# Patient Record
Sex: Female | Born: 1965 | Race: White | Hispanic: No | Marital: Married | Smoking: Never smoker
Health system: Southern US, Community
[De-identification: ages and names within clinical notes are randomized; demographics above are authoritative.]

## PROBLEM LIST (undated history)

## (undated) DIAGNOSIS — IMO0002 Reserved for concepts with insufficient information to code with codable children: Secondary | ICD-10-CM

## (undated) DIAGNOSIS — N946 Dysmenorrhea, unspecified: Secondary | ICD-10-CM

## (undated) DIAGNOSIS — R32 Unspecified urinary incontinence: Secondary | ICD-10-CM

## (undated) DIAGNOSIS — D649 Anemia, unspecified: Secondary | ICD-10-CM

## (undated) DIAGNOSIS — K5792 Diverticulitis of intestine, part unspecified, without perforation or abscess without bleeding: Secondary | ICD-10-CM

## (undated) DIAGNOSIS — F419 Anxiety disorder, unspecified: Secondary | ICD-10-CM

## (undated) DIAGNOSIS — D219 Benign neoplasm of connective and other soft tissue, unspecified: Secondary | ICD-10-CM

## (undated) DIAGNOSIS — M1990 Unspecified osteoarthritis, unspecified site: Secondary | ICD-10-CM

## (undated) HISTORY — DX: Unspecified osteoarthritis, unspecified site: M19.90

## (undated) HISTORY — DX: Anxiety disorder, unspecified: F41.9

## (undated) HISTORY — DX: Reserved for concepts with insufficient information to code with codable children: IMO0002

## (undated) HISTORY — DX: Dysmenorrhea, unspecified: N94.6

## (undated) HISTORY — DX: Benign neoplasm of connective and other soft tissue, unspecified: D21.9

## (undated) HISTORY — DX: Unspecified urinary incontinence: R32

## (undated) HISTORY — DX: Anemia, unspecified: D64.9

## (undated) HISTORY — DX: Diverticulitis of intestine, part unspecified, without perforation or abscess without bleeding: K57.92

---

## 2000-05-22 ENCOUNTER — Ambulatory Visit (HOSPITAL_COMMUNITY): Admission: RE | Admit: 2000-05-22 | Discharge: 2000-05-22 | Payer: Self-pay | Admitting: *Deleted

## 2000-05-22 ENCOUNTER — Encounter: Payer: Self-pay | Admitting: *Deleted

## 2001-11-24 ENCOUNTER — Ambulatory Visit (HOSPITAL_COMMUNITY): Admission: RE | Admit: 2001-11-24 | Discharge: 2001-11-24 | Payer: Self-pay | Admitting: *Deleted

## 2001-11-24 ENCOUNTER — Encounter: Payer: Self-pay | Admitting: *Deleted

## 2007-10-09 ENCOUNTER — Encounter: Admission: RE | Admit: 2007-10-09 | Discharge: 2007-10-09 | Payer: Self-pay | Admitting: Obstetrics and Gynecology

## 2009-10-13 ENCOUNTER — Encounter: Admission: RE | Admit: 2009-10-13 | Discharge: 2009-10-13 | Payer: Self-pay | Admitting: Obstetrics and Gynecology

## 2010-10-11 ENCOUNTER — Other Ambulatory Visit: Payer: Self-pay | Admitting: Obstetrics and Gynecology

## 2010-10-11 DIAGNOSIS — Z1231 Encounter for screening mammogram for malignant neoplasm of breast: Secondary | ICD-10-CM

## 2010-10-24 ENCOUNTER — Ambulatory Visit
Admission: RE | Admit: 2010-10-24 | Discharge: 2010-10-24 | Disposition: A | Discharge status: Home / Self Care | Payer: Self-pay | Source: Ambulatory Visit | Attending: Obstetrics and Gynecology | Admitting: Obstetrics and Gynecology

## 2010-10-24 ENCOUNTER — Other Ambulatory Visit: Payer: Self-pay | Admitting: Obstetrics and Gynecology

## 2010-10-24 DIAGNOSIS — Z1231 Encounter for screening mammogram for malignant neoplasm of breast: Secondary | ICD-10-CM

## 2011-01-20 ENCOUNTER — Encounter: Payer: Self-pay | Admitting: Family Medicine

## 2011-01-20 ENCOUNTER — Inpatient Hospital Stay (INDEPENDENT_AMBULATORY_CARE_PROVIDER_SITE_OTHER)
Admission: RE | Admit: 2011-01-20 | Discharge: 2011-01-20 | Disposition: A | Discharge status: Home / Self Care | Payer: BC Managed Care – PPO | Source: Ambulatory Visit | Attending: Family Medicine | Admitting: Family Medicine

## 2011-01-20 DIAGNOSIS — M25569 Pain in unspecified knee: Secondary | ICD-10-CM

## 2011-01-20 DIAGNOSIS — IMO0001 Reserved for inherently not codable concepts without codable children: Secondary | ICD-10-CM

## 2011-01-20 DIAGNOSIS — R51 Headache: Secondary | ICD-10-CM

## 2011-01-20 DIAGNOSIS — R509 Fever, unspecified: Secondary | ICD-10-CM

## 2011-01-20 LAB — CONVERTED CEMR LAB
Nitrite: NEGATIVE
pH: 7

## 2011-01-24 ENCOUNTER — Encounter: Payer: Self-pay | Admitting: Family Medicine

## 2011-02-16 ENCOUNTER — Encounter (INDEPENDENT_AMBULATORY_CARE_PROVIDER_SITE_OTHER): Payer: Self-pay | Admitting: *Deleted

## 2011-02-16 ENCOUNTER — Inpatient Hospital Stay (INDEPENDENT_AMBULATORY_CARE_PROVIDER_SITE_OTHER)
Admission: RE | Admit: 2011-02-16 | Discharge: 2011-02-16 | Disposition: A | Discharge status: Home / Self Care | Payer: BC Managed Care – PPO | Source: Ambulatory Visit

## 2011-02-16 DIAGNOSIS — IMO0001 Reserved for inherently not codable concepts without codable children: Secondary | ICD-10-CM | POA: Insufficient documentation

## 2011-02-17 ENCOUNTER — Encounter: Payer: Self-pay | Admitting: Family Medicine

## 2011-08-06 NOTE — Progress Notes (Signed)
Summary: Followup Call  Followup call to patient; discussed lab results.  She feels much better.  Notes that she she still has a minimal amount of discomfort in her left knee.  She notes that she had been taking Tylenol for knee pain and myalgias, which could explain her slightly elevated transaminases.  Plan:  Because urine culture revealed group B strep, will switch to AMOXICILLIN 875 MG TABS (AMOXICILLIN) One by mouth two times a day for one week. Recommend repeat urine culture when finished amoxicillin.  Discontinue doxycycline.  Prescriptions: AMOXICILLIN 875 MG TABS (AMOXICILLIN) One by mouth two times a day  #14 x 0   Entered and Authorized by:   Donna Christen MD   Signed by:   Donna Christen MD on 01/24/2011   Method used:   Electronically to        [facility] [phone]* (retail)       [address] [place], [place]  [address]       Ph: [phone]       Fax: [phone]   RxID:   [id number]   Donna Christen MD  Jan 24, 2011 1:18 PM

## 2011-08-06 NOTE — Assessment & Plan Note (Signed)
Summary: REPEAT LABS FOR ELEVATED LIVER PANELS  Nurse Visit   Allergies: No Known Drug Allergies  Orders Added: 1)  TLB-Hepatic/Liver Function Pnl [80076-HEPATIC]  Blood for hepatic panel drawn.  Lajean Saver RN  February 16, 2011 12:54 PM

## 2011-08-06 NOTE — Progress Notes (Signed)
Summary: ACHES,FEVER,HEADACHE(RM5)   Vital Signs:  Patient Profile:   45 Years Old Female CC:      Headache/fever/ body ache Height:     68 inches Weight:      155 pounds O2 Sat:      97 % O2 treatment:    Room Air Temp:     99.2 degrees F oral Pulse rate:   96 / minute Resp:     18 per minute BP sitting:   107 / 72  (left arm) Cuff size:   regular  Vitals Entered By: Linton Flemings RN (Jan 20, 2011 9:31 AM)                  Updated Prior Medication List: No Medications Current Allergies: No known allergies History of Present Illness Chief Complaint: Headache/fever/ body ache History of Present Illness:  Subjective: Patient complains of onset two days prior of fatigue, mild frontal headache, myalgias from the waist down, and light-headedness.  Yesterday she had aching in her left knee.   No known tick bites.  She notes that she is presently having her menses No sore throat No cough No pleuritic pain No wheezing No nasal congestion No post-nasal drainage No sinus pain/pressure No itchy/red eyes No earache No hemoptysis No SOB + fever/chills.  She had a fever to 103 No nausea No vomiting No abdominal pain No diarrhea No skin rashes No urinary symptoms     REVIEW OF SYSTEMS Constitutional Symptoms       Complains of fever, chills, and fatigue.     Denies night sweats, weight loss, and weight gain.  Eyes       Complains of eye pain.      Denies change in vision, eye discharge, glasses, contact lenses, and eye surgery. Ear/Nose/Throat/Mouth       Denies hearing loss/aids, change in hearing, ear pain, ear discharge, dizziness, frequent runny nose, frequent nose bleeds, sinus problems, sore throat, hoarseness, and tooth pain or bleeding.  Respiratory       Denies dry cough, productive cough, wheezing, shortness of breath, asthma, bronchitis, and emphysema/COPD.  Cardiovascular       Complains of tires easily with exhertion.      Denies murmurs and chest  pain.    Gastrointestinal       Denies stomach pain, nausea/vomiting, diarrhea, constipation, blood in bowel movements, and indigestion. Genitourniary       Denies painful urination, kidney stones, and loss of urinary control. Neurological       Denies paralysis, seizures, and fainting/blackouts. Musculoskeletal       Complains of muscle pain and joint stiffness.      Denies joint pain, decreased range of motion, redness, swelling, muscle weakness, and gout.  Skin       Denies bruising, unusual mles/lumps or sores, and hair/skin or nail changes.  Psych       Denies mood changes, temper/anger issues, anxiety/stress, speech problems, depression, and sleep problems. Other Comments: SYMPTOMS STARTED wED W/ T- MAX 103, BODY ACHES FROM THE WAIST DOWN AND PAIN BELOW LEFT KNESS   Past History:  Past Medical History: Unremarkable  Past Surgical History: Denies surgical history  Family History: CA- FATHER CAD- FATHER   Objective:  Appearance:  Patient appears healthy, stated age, and in no acute distress  Eyes:  Pupils are equal, round, and reactive to light and accomodation.  Extraocular movement is intact.  Conjunctivae are not inflamed.  Skin:  No rash Ears:  Canals normal.  Tympanic membranes normal.   Nose:  Mildly congested turbinates.  No sinus tenderness  Mouth:  No lesions Pharynx:  Normal  Neck:  Supple.  Slightly tender shotty posterior nodes are palpated bilaterally.  Lungs:  Clear to auscultation.  Breath sounds are equal.  Chest:  Tenderness over sternum Heart:  Regular rate and rhythm without murmurs, rubs, or gallops.  Abdomen:  Mild tenderness over spleen without masses or hepatosplenomegaly.  Bowel sounds are present.  No CVA or flank tenderness.   No lower abdominal or pelvic tenderness CBC:  WBC 8.0; 13.3 LY, 6.1 MO, 80.6 GR; Hgb 13.0 urinalysis (dipstick):  3+ blood, 3+ leuks Assessment New Problems: KNEE PAIN, LEFT (ICD-719.46) HEADACHE (ICD-784.0) MYALGIA  (ICD-729.1) FEVER UNSPECIFIED (ICD-780.60)  ? LYMES DISEASE, ? UTI, ? EARLY URI  Plan New Medications/Changes: DOXYCYCLINE HYCLATE 100 MG CAPS (DOXYCYCLINE HYCLATE) One by mouth two times a day  #20 x 0, 01/20/2011, Donna Christen MD  New Orders: CBC w/Diff [[address]-60454] Urinalysis [CPT-81003] T-Sed Rate (Automated) [09811-91478] T-CMP with estimated GFR [[address]] T-Lyme Disease [29562-13086] T-Rock[place] Mtn Spotted Fever AB IgG IgM [57846-96295] T-CK Total [82550-23250] T-Culture, Urine [28413-24401] Services provided After hours-Weekends-Holidays [99051] New Patient Level V [99205] Planning Comments:   Urine culture pending,  RMSF and Lymes disease titers.  Check sed rate, CMP, and CK.  Begin empiric doxycycline.  Rest, increased fluids.   The patient and/or caregiver has been counseled thoroughly with regard to medications prescribed including dosage, schedule, interactions, rationale for use, and possible side effects and they verbalize understanding.  Diagnoses and expected course of recovery discussed and will return if not improved as expected or if the condition worsens. Patient and/or caregiver verbalized understanding.  Prescriptions: DOXYCYCLINE HYCLATE 100 MG CAPS (DOXYCYCLINE HYCLATE) One by mouth two times a day  #20 x 0   Entered and Authorized by:   Donna Christen MD   Signed by:   Donna Christen MD on 01/20/2011   Method used:   Print then Give to Patient   RxID:   [id number]   Orders Added: 1)  CBC w/Diff [59563-87564] 2)  Urinalysis [CPT-81003] 3)  T-Sed Rate (Automated) [33295-18841] 4)  T-CMP with estimated GFR [[address]] 5)  T-Lyme Disease [66063-01601] 6)  T-Rocky Mtn Spotted Fever AB IgG IgM [09323-55732] 7)  T-CK Total [82550-23250] 8)  T-Culture, Urine [20254-27062] 9)  Services provided After hours-Weekends-Holidays [99051] 10)  New Patient Level V [99205]    Laboratory Results   Urine Tests  Date/Time Received: Jan 20, 2011 10:26 AM    Date/Time Reported: Jan 20, 2011 10:26 AM   Routine Urinalysis   Color: yellow Appearance: Clear Glucose: negative   (Normal Range: Negative) Bilirubin: negative   (Normal Range: Negative) Ketone: negative   (Normal Range: Negative) Spec. Gravity: 1.020   (Normal Range: 1.003-1.035) Blood: 3+   (Normal Range: Negative) pH: 7.0   (Normal Range: 5.0-8.0) Protein: 1+   (Normal Range: Negative) Urobilinogen: 0.2   (Normal Range: 0-1) Nitrite: negative   (Normal Range: Negative) Leukocyte Esterace: 3+   (Normal Range: Negative)

## 2011-08-06 NOTE — Letter (Signed)
Summary: Out of Work  [facility]  [address]   [place], [place] [address]   Phone: [phone]  Fax: [phone]    Jan 20, 2011   Employee:  [name]    To Whom It May Concern:   Lynn Schmidt was evaluated in our clinic this morning.   If you need additional information, please feel free to contact our office.         Sincerely,    Donna Christen MD

## 2011-08-06 NOTE — Progress Notes (Signed)
Summary: Followup Call  Followup call:  notified of lab results Donna Christen MD  February 17, 2011 9:54 AM

## 2011-10-15 ENCOUNTER — Other Ambulatory Visit: Payer: Self-pay | Admitting: Obstetrics and Gynecology

## 2011-10-15 DIAGNOSIS — Z1231 Encounter for screening mammogram for malignant neoplasm of breast: Secondary | ICD-10-CM

## 2011-10-23 ENCOUNTER — Inpatient Hospital Stay: Admission: RE | Admit: 2011-10-23 | Payer: BC Managed Care – PPO | Source: Ambulatory Visit

## 2011-10-30 ENCOUNTER — Ambulatory Visit
Admission: RE | Admit: 2011-10-30 | Discharge: 2011-10-30 | Disposition: A | Discharge status: Home / Self Care | Payer: BC Managed Care – PPO | Source: Ambulatory Visit | Attending: Obstetrics and Gynecology | Admitting: Obstetrics and Gynecology

## 2012-06-23 ENCOUNTER — Emergency Department
Admission: EM | Admit: 2012-06-23 | Discharge: 2012-06-23 | Disposition: A | Discharge status: Home / Self Care | Payer: Self-pay | Source: Home / Self Care

## 2012-06-23 DIAGNOSIS — R51 Headache: Secondary | ICD-10-CM

## 2012-06-23 LAB — POCT CBC W AUTO DIFF (K'VILLE URGENT CARE)

## 2012-06-23 MED ORDER — PROMETHAZINE HCL 25 MG PO TABS: 25.0000 mg | ORAL_TABLET | Freq: Four times a day (QID) | ORAL | Status: DC | PRN

## 2012-06-23 MED ORDER — KETOROLAC TROMETHAMINE 60 MG/2ML IM SOLN
60.0000 mg | Freq: Once | INTRAMUSCULAR | Status: AC
  Administered 2012-06-23: 60 mg via INTRAMUSCULAR

## 2012-06-23 MED ORDER — SUMATRIPTAN SUCCINATE 25 MG PO TABS: 25.0000 mg | ORAL_TABLET | ORAL | Status: DC | PRN

## 2012-06-23 NOTE — ED Provider Notes (Signed)
History     CSN: [id number]  Arrival date & time 06/23/12  1619   First MD Initiated Contact with Patient 06/23/12 1622      Chief Complaint  Patient presents with  . Headache    x 1 day  . Nausea    x 1 day   HPI Comments: Pt reports prior history of recurrent headaches over the last 20 years without the formal diagnosis of migraine (has not been formally evaluated for this).  Pt states that headache is usually frontal in nature, however has had frontal and occipital headache over last 24 hours.  Pt also reports blurred vision and nausea associated with this headache.  Nausea is common occurrence with headaches.  Blurred vision is a rare association with headaches per pt.  + photophobia.  No LOV, hemiparesis, confusion.   Patient is a 46 y.o. female presenting with headaches.  Headache The primary symptoms include headaches, visual change and nausea. Primary symptoms do not include fever. The symptoms began yesterday (Pt states that she had mild symptoms yesterday but woke up this am with severe frontal as well as occipital headache. ). The symptoms are unchanged.  The headache is associated with visual change.  Pt does report that HA woke her up from sleep.   History reviewed. No pertinent past medical history.  History reviewed. No pertinent past surgical history.  Family History  Problem Relation Age of Onset  . Cancer Mother     lung and skin  . Heart attack Mother     History  Substance Use Topics  . Smoking status: Never Smoker   . Smokeless tobacco: Never Used  . Alcohol Use: No    OB History    Grav Para Term Preterm Abortions TAB SAB Ect Mult Living                  Review of Systems  Constitutional: Negative for fever.  Gastrointestinal: Positive for nausea.  Neurological: Positive for headaches.  All other systems reviewed and are negative.    Allergies  Review of patient's allergies indicates no known allergies.  Home Medications  No  current outpatient prescriptions on file.  BP 117/79  Pulse 76  Temp 97.6 F (36.4 C) (Oral)  Resp 16  Ht 5\' 7"  (1.702 m)  Wt 147 lb (66.679 kg)  BMI 23.02 kg/m2  SpO2 99%  Physical Exam  Constitutional: She appears well-developed and well-nourished.  HENT:  Head: Normocephalic and atraumatic.  Right Ear: External ear normal.  Left Ear: External ear normal.  Mouth/Throat: Oropharynx is clear and moist.  Eyes: Conjunctivae normal and EOM are normal. Pupils are equal, round, and reactive to light.  Neck: Normal range of motion. Neck supple.  Cardiovascular: Normal rate and regular rhythm.   Pulmonary/Chest: Effort normal and breath sounds normal.  Abdominal: Soft. Bowel sounds are normal.  Musculoskeletal: Normal range of motion.  Neurological: She is alert. No cranial nerve deficit. Coordination normal.  Skin: Skin is warm.    ED Course  Procedures (including critical care time)  Labs Reviewed - No data to display No results found.   1. Headache       MDM  Sxs seem to be fairly consistent with migrainous flare.  However, given change in morphology of headache as well as HA waking pt up from sleep, will obtain Head CT w/ and w/o contrast to eval for intracranial process.  Toradol and phenergan for symptomatic management.  Will place on trial of imitrex.  Plan to follow up with PCP if sxs persist for formal evaluation.      The patient and/or caregiver has been counseled thoroughly with regard to treatment plan and/or medications prescribed including dosage, schedule, interactions, rationale for use, and possible side effects and they verbalize understanding. Diagnoses and expected course of recovery discussed and will return if not improved as expected or if the condition worsens. Patient and/or caregiver verbalized understanding.             Doree Albee, MD 06/23/12 [phone]

## 2012-06-23 NOTE — ED Notes (Signed)
Lynn Schmidt complains of headache and nausea for 1 day. She has also had chills, pressure in face, vomiting and vision changes. Denies fever or sweats. She tried tylenol and sudafed without relief.

## 2012-06-24 ENCOUNTER — Telehealth: Payer: Self-pay | Admitting: *Deleted

## 2012-06-24 ENCOUNTER — Ambulatory Visit (HOSPITAL_BASED_OUTPATIENT_CLINIC_OR_DEPARTMENT_OTHER)
Admission: RE | Admit: 2012-06-24 | Discharge: 2012-06-24 | Disposition: A | Discharge status: Home / Self Care | Payer: BC Managed Care – PPO | Source: Ambulatory Visit | Attending: Family Medicine | Admitting: Family Medicine

## 2012-06-24 DIAGNOSIS — R112 Nausea with vomiting, unspecified: Secondary | ICD-10-CM | POA: Insufficient documentation

## 2012-06-24 DIAGNOSIS — R51 Headache: Secondary | ICD-10-CM | POA: Insufficient documentation

## 2012-06-24 DIAGNOSIS — H538 Other visual disturbances: Secondary | ICD-10-CM | POA: Insufficient documentation

## 2012-06-24 LAB — COMPREHENSIVE METABOLIC PANEL
Alkaline Phosphatase: 35 U/L — ABNORMAL LOW (ref 39–117)
BUN: 12 mg/dL (ref 6–23)
Creat: 0.66 mg/dL (ref 0.50–1.10)
Glucose, Bld: 113 mg/dL — ABNORMAL HIGH (ref 70–99)
Sodium: 140 mEq/L (ref 135–145)
Total Bilirubin: 0.3 mg/dL (ref 0.3–1.2)

## 2012-06-24 MED ORDER — IOHEXOL 300 MG/ML  SOLN
80.0000 mL | Freq: Once | INTRAMUSCULAR | Status: AC | PRN
  Administered 2012-06-24: 80 mL via INTRAVENOUS

## 2012-06-25 ENCOUNTER — Telehealth: Payer: Self-pay | Admitting: *Deleted

## 2012-11-03 ENCOUNTER — Other Ambulatory Visit: Payer: Self-pay | Admitting: Obstetrics & Gynecology

## 2012-11-04 ENCOUNTER — Ambulatory Visit (INDEPENDENT_AMBULATORY_CARE_PROVIDER_SITE_OTHER): Payer: BC Managed Care – PPO

## 2012-11-04 DIAGNOSIS — Z1231 Encounter for screening mammogram for malignant neoplasm of breast: Secondary | ICD-10-CM

## 2013-07-09 ENCOUNTER — Other Ambulatory Visit: Payer: Self-pay

## 2013-12-14 ENCOUNTER — Other Ambulatory Visit: Payer: Self-pay

## 2013-12-14 DIAGNOSIS — Z1231 Encounter for screening mammogram for malignant neoplasm of breast: Secondary | ICD-10-CM

## 2013-12-23 ENCOUNTER — Ambulatory Visit
Admission: RE | Admit: 2013-12-23 | Discharge: 2013-12-23 | Disposition: A | Discharge status: Home / Self Care | Payer: BC Managed Care – PPO | Source: Ambulatory Visit

## 2013-12-23 DIAGNOSIS — Z1231 Encounter for screening mammogram for malignant neoplasm of breast: Secondary | ICD-10-CM

## 2014-08-09 ENCOUNTER — Ambulatory Visit (INDEPENDENT_AMBULATORY_CARE_PROVIDER_SITE_OTHER): Payer: BC Managed Care – PPO | Admitting: Obstetrics and Gynecology

## 2014-08-09 ENCOUNTER — Encounter: Payer: Self-pay | Admitting: Obstetrics and Gynecology

## 2014-08-09 VITALS — BP 122/82 | HR 80 | Resp 14 | Ht 67.0 in | Wt 161.6 lb

## 2014-08-09 DIAGNOSIS — D259 Leiomyoma of uterus, unspecified: Secondary | ICD-10-CM

## 2014-08-09 LAB — HEMOGLOBIN, FINGERSTICK: Hemoglobin, fingerstick: 12.2 g/dL (ref 12.0–16.0)

## 2014-08-09 NOTE — Patient Instructions (Signed)
Abdominal Hysterectomy Abdominal hysterectomy is a surgical procedure to remove your womb (uterus). Your uterus is the muscular organ that contains a developing baby. This surgery is done for many reasons. You may need an abdominal hysterectomy if you have cancer, growths (tumors), long-term pain, or bleeding. You may also have this procedure if your uterus has slipped down into your vagina (uterine prolapse). Depending on why you need an abdominal hysterectomy, you may also have other reproductive organs removed. These could include the part of your vagina that connects with your uterus (cervix), the organs that make eggs (ovaries), and the tubes that connect the ovaries to the uterus (fallopian tubes). LET YOUR HEALTH CARE PROVIDER KNOW ABOUT:   Any allergies you have.  All medicines you are taking, including vitamins, herbs, eye drops, creams, and over-the-counter medicines.  Previous problems you or members of your family have had with the use of anesthetics.  Any blood disorders you have.  Previous surgeries you have had.  Medical conditions you have. RISKS AND COMPLICATIONS Generally, this is a safe procedure. However, as with any procedure, problems can occur. Infection is the most common problem after an abdominal hysterectomy. Other possible problems include:  Bleeding.  Formation of blood clots that may break free and travel to your lungs.  Injury to other organs near your uterus.  Nerve injury causing nerve pain.  Decreased interest in sex or pain during sexual intercourse. BEFORE THE PROCEDURE  Abdominal hysterectomy is a major surgical procedure. It can affect the way you feel about yourself. Talk to your health care provider about the physical and emotional changes hysterectomy may cause.  You may need to have blood work and X-rays done before surgery.  Quit smoking if you smoke. Ask your health care provider for help if you are struggling to quit.  Stop taking  medicines that thin your blood as directed by your health care provider.  You may be instructed to take antibiotic medicines or laxatives before surgery.  Do not eat or drink anything for 6-8 hours before surgery.  Take your regular medicines with a small sip of water.  Bathe or shower the night or morning before surgery. PROCEDURE  Abdominal hysterectomy is done in the operating room at the hospital.  In most cases, you will be given a medicine that makes you go to sleep (general anesthetic).  The surgeon will make a cut (incision) through the skin in your lower belly.  The incision may be about 5-7 inches long. It may go side-to-side or up-and-down.  The surgeon will move aside the body tissue that covers your uterus. The surgeon will then carefully take out your uterus along with any of your other reproductive organs that need to be removed.  Bleeding will be controlled with clamps or sutures.  The surgeon will close your incision with sutures or metal clips. AFTER THE PROCEDURE  You will have some pain immediately after the procedure.  You will be given pain medicine in the recovery room.  You will be taken to your hospital room when you have recovered from the anesthesia.  You may need to stay in the hospital for 2-5 days.  You will be given instructions for recovery at home. Document Released: 08/25/2013 Document Reviewed: 08/25/2013 Exit[employer] Patient Information 2015 [employer]. This information is not intended to replace advice given to you by your health care provider. Make sure you discuss any questions you have with your health care provider. Uterine Fibroid A uterine fibroid is a   growth (tumor) that occurs in your uterus. This type of tumor is not cancerous and does not spread out of the uterus. You can have one or many fibroids. Fibroids can vary in size, weight, and where they grow in the uterus. Some can become quite large. Most fibroids do not require medical  treatment, but some can cause pain or heavy bleeding during and between periods. CAUSES  A fibroid is the result of a single uterine cell that keeps growing (unregulated), which is different than most cells in the human body. Most cells have a control mechanism that keeps them from reproducing without control.  SIGNS AND SYMPTOMS   Bleeding.  Pelvic pain and pressure.  Bladder problems due to the size of the fibroid.  Infertility and miscarriages depending on the size and location of the fibroid. DIAGNOSIS  Uterine fibroids are diagnosed through a physical exam. Your health care provider may feel the lumpy tumors during a pelvic exam. Ultrasonography may be done to get information regarding size, location, and number of tumors.  TREATMENT   Your health care provider may recommend watchful waiting. This involves getting the fibroid checked by your health care provider to see if it grows or shrinks.   Hormone treatment or an intrauterine device (IUD) may be prescribed.   Surgery may be needed to remove the fibroids (myomectomy) or the uterus (hysterectomy). This depends on your situation. When fibroids interfere with fertility and a woman wants to become pregnant, a health care provider may recommend having the fibroids removed.  HOME CARE INSTRUCTIONS  Home care depends on how you were treated. In general:   Keep all follow-up appointments with your health care provider.   Only take over-the-counter or prescription medicines as directed by your health care provider. If you were prescribed a hormone treatment, take the hormone medicines exactly as directed. Do not take aspirin. It can cause bleeding.   Talk to your health care provider about taking iron pills.  If your periods are troublesome but not so heavy, lie down with your feet raised slightly above your heart. Place cold packs on your lower abdomen.   If your periods are heavy, write down the number of pads or tampons you  use per month. Bring this information to your health care provider.   Include green vegetables in your diet.  SEEK IMMEDIATE MEDICAL CARE IF:  You have pelvic pain or cramps not controlled with medicines.   You have a sudden increase in pelvic pain.   You have an increase in bleeding between and during periods.   You have excessive periods and soak tampons or pads in a half hour or less.  You feel lightheaded or have fainting episodes. Document Released: 08/17/2000 Document Revised: 06/10/2013 Document Reviewed: 03/19/2013 Exit[employer] Patient Information 2015 [employer]. This information is not intended to replace advice given to you by your health care provider. Make sure you discuss any questions you have with your health care provider.  

## 2014-08-09 NOTE — Progress Notes (Signed)
Patient ID: Lynn Schmidt, female   DOB: 05-20-1966, 48 y.o.   MRN: [id number]   ------ Goes by  Lynn Schmidt" GYNECOLOGY VISIT  PCP:   None  Referring provider:   HPI: 48 y.o.   Married  Caucasian  female   [phone] with Patient's last menstrual period was 07/28/2014 (exact date).   here for 2nd opinion regarding fibroids.  [facility] is her usual provider. Diagnosed with fibroids 9 - 12 week size.  Was on birth control for a while but stopped due to mother with estrogen sensitive breast cancer.  Tried Lupron for 3 months and had ultrasound showing no change.  Did a second round of the Lupron and developed insomnia and joint pain which is persistent.  Is not able to do 5Ks now due to joint pain and deconditioning.  20 pound weight gain.   Cycles started back in May 2015.  Now cycles are heavy again with clotting.  History of prior anemia.   Now considering hysterectomy.  Declines use of morcellator.   Urinary frequency.  Can leak a little bit with sneezing.   Dyspepsia.   EMB negative 2014.   Patient is an Therapist, sports at [employer].   Hgb:  12.2   GYNECOLOGIC HISTORY: Patient's last menstrual period was 07/28/2014 (exact date). Sexually active:  yes Partner preference: female Contraception:  vasectomy  Menopausal hormone therapy: n/a DES exposure:  no  Blood transfusions:  no  Sexually transmitted diseases:   no GYN procedures and prior surgeries: no  Last mammogram:  12-23-13 wnl:[facility]               Last pap and high risk HPV testing: 03/2014 wnl   History of abnormal pap smear:  Yes, 10 years ago.  No colposcopy, no treatment of cervix.  Repeat pap normal.   OB History    Gravida Para Term Preterm AB TAB SAB Ectopic Multiple Living   3 3 3       3      3  vaginal deliveries.   LIFESTYLE: Exercise:  no           Tobacco:   no Alcohol:     1-2 drinks per month Drug use:  no  Patient Active Problem List   Diagnosis Date Noted  . MYALGIA 02/16/2011    Past  Medical History  Diagnosis Date  . Anemia   . Anxiety   . Dyspareunia   . Dysmenorrhea   . Fibroid   . Urinary incontinence     occ. with sneezing    History reviewed. No pertinent past surgical history.  Current Outpatient Prescriptions  Medication Sig Dispense Refill  . acetaminophen (TYLENOL) 500 MG tablet Take 500 mg by mouth every 6 (six) hours as needed.    . doxylamine, Sleep, (UNISOM) 25 MG tablet Take 25 mg by mouth at bedtime as needed.    . pseudoephedrine (SUDAFED) 30 MG tablet Take 30 mg by mouth every 4 (four) hours as needed.     No current facility-administered medications for this visit.     ALLERGIES: Penicillins  Family History  Problem Relation Age of Onset  . Heart attack Mother   . Breast cancer Mother 10    Estrogen receptive  . Hypertension Mother   . Hyperlipidemia Mother   . Cancer Father 3    Lung ca--dec age 52  . Hypertension Father   . Hyperlipidemia Father   . Cancer Paternal Grandmother 16    colon and  pancreatic ca    History   Social History  . Marital Status: Married    Spouse Name: N/A    Number of Children: N/A  . Years of Education: N/A   Occupational History  . Not on file.   Social History Main Topics  . Smoking status: Never Smoker   . Smokeless tobacco: Never Used  . Alcohol Use: No     Comment: rarely 1-2 drinks/mo.  . Drug Use: No  . Sexual Activity:    Partners: Male    Birth Control/ Protection: Other-see comments     Comment: vasectomy   Other Topics Concern  . Not on file   Social History Narrative    ROS:  Pertinent items are noted in HPI.  PHYSICAL EXAMINATION:    BP 122/82 mmHg  Pulse 80  Resp 14  Ht 5\' 7"  (1.702 m)  Wt 161 lb 9.6 oz (73.301 kg)  BMI 25.30 kg/m2  LMP 07/28/2014 (Exact Date)   Wt Readings from Last 3 Encounters:  08/09/14 161 lb 9.6 oz (73.301 kg)  06/23/12 147 lb (66.679 kg)  01/20/11 155 lb (70.308 kg)     Ht Readings from Last 3 Encounters:  08/09/14 5\' 7"  (1.702  m)  06/23/12 5\' 7"  (1.702 m)  01/20/11 5\' 8"  (1.727 m)    General appearance: alert, cooperative and appears stated age Head: Normocephalic, without obvious abnormality, atraumatic Neck: no adenopathy, supple, symmetrical, trachea midline and thyroid not enlarged, symmetric, no tenderness/mass/nodules Lungs: clear to auscultation bilaterally Heart: regular rate and rhythm Abdomen: enlarged midline nontender mass, soft, non-tender; no masses,  no organomegaly Extremities: extremities normal, atraumatic, no cyanosis or edema Skin: Skin color, texture, turgor normal. No rashes or lesions Lymph nodes: Cervical, supraclavicular, and inguinal nodes normal. Neurologic: Grossly normal  Pelvic: External genitalia:  no lesions              Urethra:  normal appearing urethra with no masses, tenderness or lesions              Bartholins and Skenes: normal                 Vagina: normal appearing vagina with normal color and discharge, no lesions              Cervix: normal appearance                 Bimanual Exam:  Uterus:  uterus is 16 - 17 week size.                                       Adnexa: normal adnexa in size, nontender and no masses                                      Rectovaginal: Confirms                                      Anus:  normal sphincter tone, no lesions  ASSESSMENT  Symptomatic fibroid(s).  Possible increase in size.  Status post prior depo Lupron therapy.  Genuine stress incontinence. Urinary frequency.   PLAN  Patient will obtain prior records for comparison of fibroids and to have result of EMB.  Return for  pelvic ultrasound here. I discussed abdominal hysterectomy with bilateral salpingectomy for treatment of symptomatic fibroids.  I would not recommend a laparoscopic approach as this would require extensive morcellation of the fibroid(s).  An After Visit Summary was printed and given to the patient.  30 minutes face to face time of which over 50% was spent  in counseling.

## 2014-08-11 ENCOUNTER — Telehealth: Payer: Self-pay | Admitting: Obstetrics and Gynecology

## 2014-08-11 NOTE — Telephone Encounter (Signed)
Left message for patient to call back. Need to go over benefits and schedule PUS °

## 2014-08-13 NOTE — Telephone Encounter (Signed)
Patient is returning a call to [place]. I told patient Lynn Schmidt has gone for the day and needs to go over her benefits. Patient would like to talk to someone today to get this scheduled. I told patient I would ask if triage could call her to schedule PUS and have Sabrina call her Monday to go over benefits.

## 2014-08-13 NOTE — Telephone Encounter (Signed)
Spoke with patient. Patient would like to schedule ultrasound at this time. Appointment scheduled for 12/17 at 11:30am with 12pm consult with Dr.Silva. Patient is agreeable to date and time. Advised patient will have Sabrina call early part of next week to discuss OOP and benefits for appointment as she is currently out of the office. Patient is agreeable.  Cc: Felipa Emory

## 2014-08-13 NOTE — Telephone Encounter (Signed)
Left message to call Kaitlyn at [phone]. 

## 2014-08-15 NOTE — Telephone Encounter (Signed)
Thanks for the update

## 2014-08-16 ENCOUNTER — Telehealth: Payer: Self-pay | Admitting: Obstetrics and Gynecology

## 2014-08-16 NOTE — Telephone Encounter (Signed)
Spoke with patient. Rescheduled PUS. Advised patient of 72 hour cancellation policy and $829 cancellation fee. Patient agreeable.

## 2014-08-16 NOTE — Telephone Encounter (Signed)
Spoke with patient. Advised that per benefit quote received, she will be responsible to pay $294.23 when she comes in for her PUS on 12.17.2015. Patient agreeable.

## 2014-08-16 NOTE — Telephone Encounter (Signed)
Left message for patient to call back. ID: [id number] per rep patients updated id # is [id number] DOB: 1966/04/30 CPT: 10175/ZW Dx:    Effective Date: 01.01.2014 Termination Date: Benefit period: Pre-Existing: n/a  Date: 12.08.2015 Time: 1058 Rep: Ada  Copay: $35 Deductible: $700 ([id number]) (0 met) OOP: $4000 Coins: 80/20 PAC: n/a Ref# [id number] Allowed amount: $462.38   PR: $35+$259.23= PR $294.23

## 2014-08-16 NOTE — Telephone Encounter (Signed)
Pt wants to reschedule her appointment for ultrasound. She would like to schedule after the holidays.

## 2014-08-19 ENCOUNTER — Other Ambulatory Visit: Payer: BC Managed Care – PPO

## 2014-08-19 ENCOUNTER — Other Ambulatory Visit: Payer: BC Managed Care – PPO | Admitting: Obstetrics and Gynecology

## 2014-09-09 ENCOUNTER — Ambulatory Visit (INDEPENDENT_AMBULATORY_CARE_PROVIDER_SITE_OTHER): Payer: BLUE CROSS/BLUE SHIELD | Admitting: Obstetrics and Gynecology

## 2014-09-09 ENCOUNTER — Ambulatory Visit (INDEPENDENT_AMBULATORY_CARE_PROVIDER_SITE_OTHER): Payer: BLUE CROSS/BLUE SHIELD

## 2014-09-09 ENCOUNTER — Encounter: Payer: Self-pay | Admitting: Obstetrics and Gynecology

## 2014-09-09 VITALS — BP 100/70 | HR 70 | Ht 67.0 in | Wt 166.0 lb

## 2014-09-09 DIAGNOSIS — D259 Leiomyoma of uterus, unspecified: Secondary | ICD-10-CM

## 2014-09-09 NOTE — Progress Notes (Signed)
Subjective  Patient is here today for pelvic ultrasound to check fibroids.   49 y.o. Married Caucasian female  [facility] is her usual provider. Diagnosed with fibroids 9 - 12 week size.  Last ultrasound was 07/10/13 -  Fibroid 7.1 x 5.7 cm. EMB 2014 benign.   Was on birth control for a while but stopped due to mother with estrogen sensitive breast cancer.  Tried Lupron for 3 months and had ultrasound showing no change.  Did a second round of the Lupron and developed insomnia and joint pain which is persistent.  Is not able to do 5Ks now due to joint pain and deconditioning.  20 pound weight gain.   Cycles started back in May 2015.  Now cycles are heavy again with clotting.  History of prior anemia.   Now considering hysterectomy.  Declines use of morcellator.   Urinary frequency.  Can leak a little bit with sneezing.  Happens once every 2 weeks.  Symptoms of leakage started 1.5 years ago.  Declines surgical care for incontinence.   Husband with vasectomy.   Patient is an Therapist, sports at [employer].   Objective  Uterus with 2 fibroids - 8.37 (right mid to lower uterus) and 1.94 cm (encroaching on endometrial cavity). EMS 12.09 mm. Ovaries normal. No free fluid.      Assessment  Uterine fibroids.  One measuring 10 cm in largest diameter.  Stress incontinence.  May be partially due to weight of fibroids on bladder.   Plan  Discussed options to care - medical therapy with hormonal contraception, repeat Depo Lupron, uterine artery embolization, observation.   Patient would like to proceed with abdominal hysterectomy with bilateral salpingectomy.  I have reviewed benefits and risks which include but are not limited to bleeding, transfusion, damage to surrounding organs, reaction to anesthesia, pneumonia, DVT, PE, death, and need for reoperation.  Surgical procedure and recovery expectations reviewed.   Will get copy of EMB from [place]  2014.   25 minutes face to face time of which over 50% was spent in counseling.   After visit summary to patient.

## 2014-09-09 NOTE — Patient Instructions (Signed)
Abdominal Hysterectomy Abdominal hysterectomy is a surgical procedure to remove your womb (uterus). Your uterus is the muscular organ that contains a developing baby. This surgery is done for many reasons. You may need an abdominal hysterectomy if you have cancer, growths (tumors), long-term pain, or bleeding. You may also have this procedure if your uterus has slipped down into your vagina (uterine prolapse). Depending on why you need an abdominal hysterectomy, you may also have other reproductive organs removed. These could include the part of your vagina that connects with your uterus (cervix), the organs that make eggs (ovaries), and the tubes that connect the ovaries to the uterus (fallopian tubes). LET [facility] CARE PROVIDER KNOW ABOUT:   Any allergies you have.  All medicines you are taking, including vitamins, herbs, eye drops, creams, and over-the-counter medicines.  Previous problems you or members of your family have had with the use of anesthetics.  Any blood disorders you have.  Previous surgeries you have had.  Medical conditions you have. RISKS AND COMPLICATIONS Generally, this is a safe procedure. However, as with any procedure, problems can occur. Infection is the most common problem after an abdominal hysterectomy. Other possible problems include:  Bleeding.  Formation of blood clots that may break free and travel to your lungs.  Injury to other organs near your uterus.  Nerve injury causing nerve pain.  Decreased interest in sex or pain during sexual intercourse. BEFORE THE PROCEDURE  Abdominal hysterectomy is a major surgical procedure. It can affect the way you feel about yourself. Talk to your health care provider about the physical and emotional changes hysterectomy may cause.  You may need to have blood work and X-rays done before surgery.  Quit smoking if you smoke. Ask your health care provider for help if you are struggling to quit.  Stop taking  medicines that thin your blood as directed by your health care provider.  You may be instructed to take antibiotic medicines or laxatives before surgery.  Do not eat or drink anything for 6-8 hours before surgery.  Take your regular medicines with a small sip of water.  Bathe or shower the night or morning before surgery. PROCEDURE  Abdominal hysterectomy is done in the operating room at the hospital.  In most cases, you will be given a medicine that makes you go to sleep (general anesthetic).  The surgeon will make a cut (incision) through the skin in your lower belly.  The incision may be about 5-7 inches long. It may go side-to-side or up-and-down.  The surgeon will move aside the body tissue that covers your uterus. The surgeon will then carefully take out your uterus along with any of your other reproductive organs that need to be removed.  Bleeding will be controlled with clamps or sutures.  The surgeon will close your incision with sutures or metal clips. AFTER THE PROCEDURE  You will have some pain immediately after the procedure.  You will be given pain medicine in the recovery room.  You will be taken to your hospital room when you have recovered from the anesthesia.  You may need to stay in the hospital for 2-5 days.  You will be given instructions for recovery at home. Document Released: 08/25/2013 Document Reviewed: 08/25/2013 [facility] Patient Information 2015 [place], [place]. This information is not intended to replace advice given to you by your health care provider. Make sure you discuss any questions you have with your health care provider.

## 2014-09-13 ENCOUNTER — Telehealth: Payer: Self-pay | Admitting: Obstetrics and Gynecology

## 2014-09-13 NOTE — Telephone Encounter (Signed)
Left message at patients home # for a call back. Need to go over surgery benefits

## 2014-09-22 NOTE — Telephone Encounter (Signed)
Left message at patients mobile and home # for a call back. Need to go over surgery benefits

## 2014-09-29 NOTE — Telephone Encounter (Signed)
Spoke to patient regarding available dates. She is considering early April 2016. Scheduling policies discussed. Patient will confirm date preferences and call back when ready to schedule.  Routing to provider for final review. Patient agreeable to disposition. Will close encounter  Routing to Dr Sabra Heck in Dr Elza Rafter absence.

## 2014-09-29 NOTE — Telephone Encounter (Signed)
Spoke with patient. Advised that per benefit quote received, she will have 0 liability for the surgeon portion of her surgery. Advised that once scheduled, she will receive separate correspondence from the hospital regarding facility charges/fees. Patient agreeable.  Passed call to Gay Filler as patient has questions pertaining to scheduling.

## 2014-09-29 NOTE — Telephone Encounter (Signed)
Returning call.

## 2014-11-25 ENCOUNTER — Telehealth: Payer: Self-pay | Admitting: *Deleted

## 2014-11-25 NOTE — Telephone Encounter (Signed)
Per DPR, ok to leave message on home or cell number. Follow-up call to patient regarding surgery. Previous phone call in January 2016 patient wanted surgery in early April. Left message just calling to follow-up on plans and if she needs anything. Request call back at her convenience.

## 2014-12-24 NOTE — Telephone Encounter (Signed)
Call to patient, per ROI, can leave detailed message on [phone]. Left message calling for update regarding scheduling procedure.

## 2014-12-27 NOTE — Telephone Encounter (Signed)
No further follow up is needed.  I have closed the encounter.

## 2014-12-27 NOTE — Telephone Encounter (Signed)
No patient response. Any further follow-up needed?

## 2015-01-05 ENCOUNTER — Other Ambulatory Visit: Payer: Self-pay

## 2015-01-05 ENCOUNTER — Telehealth: Payer: Self-pay | Admitting: Obstetrics and Gynecology

## 2015-01-05 DIAGNOSIS — Z1231 Encounter for screening mammogram for malignant neoplasm of breast: Secondary | ICD-10-CM

## 2015-01-05 NOTE — Telephone Encounter (Signed)
Spoke with patient. Patient states that she has previously discussed having a hysterectomy with Dr.Silva and is now ready to proceed. Patient last seen on 09/09/2014 for PUS. Hysterectomy with bilateral salpingectomy was discussed at that [place]. Patient would like to proceed with scheduling. Advised will let Dr.Silva and nursing manager Lamont Snowball know so that surgery can be discussed and schedule. Patient is agreeable.  Cc: Lamont Snowball, RN

## 2015-01-05 NOTE — Telephone Encounter (Signed)
Patient would like an appointment with Dr Quincy Simmonds to discuss surgery.

## 2015-01-05 NOTE — Telephone Encounter (Signed)
Return call to patient. States she is considering dates for surgery this summer, probably late June. Date options and scheduling policies discussed.  Patient would like to see Dr Quincy Simmonds for follow-up before scheduling. Will be considering date options and be prepared to schedule when she comes in. Consult scheduled for 01-14-15. Patient advised he is due for mammogram and will need to update this before surgery. States she has not had any change in health status since last office visit. She is aware that billing office will contact her with updated precert information for surgery for when she is ready to proceed.  Routing to provider for final review. Patient agreeable to disposition. Patient aware MD will review message and nurse will return call with any additional instructions or change of disposition. Will close encounter.

## 2015-01-05 NOTE — Telephone Encounter (Signed)
Please proceed forward with precert and scheduling of total abdominal hysterectomy with bilateral salpingectomy, possible cystoscopy.  Diagnosis is uterine fibroids.  Time needed 2.5 hours.   Will need to get up to date on mammogram prior to surgery.   Please schedule preop at almost the one month mark prior to her surgery date so we can have the visit in plenty of time to update her care.   Please let me know if there is any change in her general health status, which would then require a visit not just at the time of the preop.   Thank you.   Jan [place], [place]

## 2015-01-07 ENCOUNTER — Telehealth: Payer: Self-pay | Admitting: Obstetrics and Gynecology

## 2015-01-07 NOTE — Telephone Encounter (Signed)
Left message for patient to call back. Need to go over surgery benefits. °

## 2015-01-11 NOTE — Telephone Encounter (Signed)
Call to patient. Voicemail has not been set up yet. Unable to leave message. Need to go over surgery coverage information.

## 2015-01-12 NOTE — Telephone Encounter (Signed)
Laqu[place] Due at 01/11/2015 4:20 PM     Status: Signed       Expand All Collapse All   Call to patient. Voicemail has not been set up yet. Unable to leave message. Need to go over surgery coverage information.

## 2015-01-14 ENCOUNTER — Telehealth: Payer: Self-pay | Admitting: Obstetrics and Gynecology

## 2015-01-14 ENCOUNTER — Ambulatory Visit: Payer: BLUE CROSS/BLUE SHIELD | Admitting: Obstetrics and Gynecology

## 2015-01-14 NOTE — Telephone Encounter (Signed)
Left message for patient to call back  

## 2015-01-14 NOTE — Telephone Encounter (Signed)
Left message on voicemail to call and reschedule cancelled appointment. °

## 2015-01-18 NOTE — Telephone Encounter (Signed)
Left message for patient to call back  

## 2015-01-19 ENCOUNTER — Ambulatory Visit
Admission: RE | Admit: 2015-01-19 | Discharge: 2015-01-19 | Disposition: A | Discharge status: Home / Self Care | Payer: BLUE CROSS/BLUE SHIELD | Source: Ambulatory Visit

## 2015-01-19 DIAGNOSIS — Z1231 Encounter for screening mammogram for malignant neoplasm of breast: Secondary | ICD-10-CM

## 2015-01-21 ENCOUNTER — Ambulatory Visit (INDEPENDENT_AMBULATORY_CARE_PROVIDER_SITE_OTHER): Payer: BLUE CROSS/BLUE SHIELD | Admitting: Obstetrics and Gynecology

## 2015-01-21 ENCOUNTER — Encounter: Payer: Self-pay | Admitting: Obstetrics and Gynecology

## 2015-01-21 VITALS — BP 100/72 | HR 78 | Ht 67.0 in | Wt 167.6 lb

## 2015-01-21 DIAGNOSIS — N920 Excessive and frequent menstruation with regular cycle: Secondary | ICD-10-CM | POA: Diagnosis not present

## 2015-01-21 DIAGNOSIS — D259 Leiomyoma of uterus, unspecified: Secondary | ICD-10-CM

## 2015-01-21 NOTE — Progress Notes (Signed)
GYNECOLOGY  VISIT   HPI: 49 y.o.   Married  Caucasian  female   [phone] with Patient's last menstrual period was 01/21/2015.   here to discuss surgery Has fibroids.  Thinks they are getting bigger. Second day of menses is very heavy.  Not taking iron or MVI regularly.   Did not like Depo Lupron in past.   Benign endometrial biopsy in 2014.   Ultrasound 09/09/14 - Uterus with 2 fibroids - 8.37 (right mid to lower uterus) and 1.94 cm (encroaching on endometrial cavity). EMS 12.09 mm. Ovaries normal. No free fluid.   Voiding often but no incontinence.  Considering surgery for hysterectomy for mid July 2016.   No change in medical status.   GYNECOLOGIC HISTORY: Patient's last menstrual period was 01/21/2015. Contraception: Husband has vasectomy Menopausal hormone therapy: none Last mammogram: 01/19/15 bi-rads 1: neg Last pap smear: 03/25/14 wnl        OB History    Gravida Para Term Preterm AB TAB SAB Ectopic Multiple Living   3 3 3       3          Patient Active Problem List   Diagnosis Date Noted  . MYALGIA 02/16/2011    Past Medical History  Diagnosis Date  . Anemia   . Anxiety   . Dyspareunia   . Dysmenorrhea   . Fibroid   . Urinary incontinence     occ. with sneezing    No past surgical history on file.  Current Outpatient Prescriptions  Medication Sig Dispense Refill  . acetaminophen (TYLENOL) 500 MG tablet Take 500 mg by mouth every 6 (six) hours as needed.    . doxylamine, Sleep, (UNISOM) 25 MG tablet Take 25 mg by mouth at bedtime as needed.    . pseudoephedrine (SUDAFED) 30 MG tablet Take 30 mg by mouth every 4 (four) hours as needed.     No current facility-administered medications for this visit.     ALLERGIES: Penicillins  Family History  Problem Relation Age of Onset  . Heart attack Mother   . Breast cancer Mother 63    Estrogen receptive  . Hypertension Mother   . Hyperlipidemia Mother   . Cancer Father 11    Lung ca--dec age 25  .  Hypertension Father   . Hyperlipidemia Father   . Cancer Paternal Grandmother 73    colon and pancreatic ca    History   Social History  . Marital Status: Married    Spouse Name: N/A  . Number of Children: N/A  . Years of Education: N/A   Occupational History  . Not on file.   Social History Main Topics  . Smoking status: Never Smoker   . Smokeless tobacco: Never Used  . Alcohol Use: No     Comment: rarely 1-2 drinks/mo.  . Drug Use: No  . Sexual Activity:    Partners: Male    Birth Control/ Protection: Other-see comments     Comment: vasectomy   Other Topics Concern  . Not on file   Social History Narrative    ROS:  Pertinent items are noted in HPI.  PHYSICAL EXAMINATION:    BP 100/72 mmHg  Pulse 78  Ht 5\' 7"  (1.702 m)  Wt 167 lb 9.6 oz (76.023 kg)  BMI 26.24 kg/m2  LMP 01/21/2015    General appearance: alert, cooperative and appears stated age   Abdomen: mass to umbilicus, soft, non-tender.  ASSESSMENT  20 week size uterus.  Known fibroids.  Menorrhagia with regular cycles.  PLAN   Counseled regarding routes of hysterectomy and benefits/risks- laparoscopic robotic with minilaparotomy versus abdominal hysterectomy with bilateral salpingectomy and possible cystoscopy.  Will proceed with an abdominal route for hysterectomy.  Patient comfortable with this. Surgical expectations and recovery discussed. Check Hgb - 12. Return for preop visit.    An After Visit Summary was printed and given to the patient.  15 minutes face to face time of which over 50% was spent in counseling.

## 2015-01-24 LAB — HEMOGLOBIN, FINGERSTICK: Hemoglobin, fingerstick: 12 g/dL (ref 12.0–16.0)

## 2015-01-25 NOTE — Telephone Encounter (Signed)
Left message for patient to call back  

## 2015-01-26 NOTE — Telephone Encounter (Signed)
Spoke with patient. Advised of benefit quote received for the surgeon portion of her surgery. Advised that payment is due in full at least 3 weeks prior to the scheduled surgery date. Patient agreeable. Advised patient that she will receive separate communication from the hospital regarding facility charges/fees/payment.  Advised that she will be hearing from [place] regarding scheduling.   Patient prefers a July surgery date.

## 2015-01-27 NOTE — Telephone Encounter (Signed)
See next phone messages regarding surgery scheduling.  Routing to provider for final review. Patient agreeable to disposition. Will close encounter.

## 2015-02-02 ENCOUNTER — Telehealth: Payer: Self-pay | Admitting: Obstetrics and Gynecology

## 2015-02-02 NOTE — Telephone Encounter (Signed)
Spoke with patient. Patient is ready to schedule surgery with Dr.Silva. Advised nurse manager who does surgery scheduling is out of the office today. Advised I will send her a message to return call to patient to set up surgery date. Patient is agreeable.  Cc: Dr.Silva

## 2015-02-02 NOTE — Telephone Encounter (Signed)
Thank you for the update.  Cc- Sally Yeakley 

## 2015-02-02 NOTE — Telephone Encounter (Signed)
Patient states she wants to make a appointment for a procedure. Patient ok for call back

## 2015-02-03 NOTE — Telephone Encounter (Signed)
Patient returned call. Ready to proceed with July surgery. Available date options and scheduling policies discussed. Patient agreeable. Will schedule and call patient back. ,

## 2015-02-03 NOTE — Telephone Encounter (Signed)
Return call to patient. VM not set up. Unable to leave message.

## 2015-02-08 NOTE — Telephone Encounter (Signed)
Call to patient. Advised surgery is scheduled for 03-29-15 at 0730 at [facility]. Should expect to arrive at 0600 but hospital will confirm. Surgery instruction sheet reviewed and printed copy mailed. See scanned copy. Surgery consult scheduled for 03-18-15 due to patient's limited schedule. Anything else needed prior to surgery consult? Per OV note from 01-27-15: MMG 01-19-15 Bi Rads 1. Pap 03-25-14 normal.

## 2015-02-08 NOTE — Telephone Encounter (Signed)
Nothing further needed.  Patient has already had EMB through her prior GYN office.  Thank you!

## 2015-02-10 NOTE — Telephone Encounter (Signed)
Encounter closed

## 2015-03-18 ENCOUNTER — Encounter: Payer: Self-pay | Admitting: Obstetrics and Gynecology

## 2015-03-18 ENCOUNTER — Ambulatory Visit (INDEPENDENT_AMBULATORY_CARE_PROVIDER_SITE_OTHER): Payer: BLUE CROSS/BLUE SHIELD | Admitting: Obstetrics and Gynecology

## 2015-03-18 VITALS — BP 110/64 | HR 70 | Resp 16 | Ht 67.0 in | Wt 171.0 lb

## 2015-03-18 DIAGNOSIS — D259 Leiomyoma of uterus, unspecified: Secondary | ICD-10-CM

## 2015-03-18 NOTE — Progress Notes (Signed)
GYNECOLOGY  VISIT   HPI: 49 y.o.   Married  Caucasian  female   [phone] with No LMP recorded.   here for   Surgical consult for hysterectomy due to uterine fibroids.  Menses are heavy second day.  Feels fibroids are enlarging.  Declines future childbearing.  Feels like she is bloated and with early satiety due to fibroids.   Benign endometrial biopsy in 2014.   Ultrasound 09/09/14 - Uterus with 2 fibroids - 8.37 (right mid to lower uterus) and 1.94 cm (encroaching on endometrial cavity). EMS 12.09 mm. Ovaries normal. No free fluid.   States gastritis with NSAID use.   GYNECOLOGIC HISTORY: No LMP recorded. Contraception:husband vasectomy Menopausal hormone therapy: none Last mammogram: 01-19-15 category b density,birads 1:neg Last pap smear: 03-25-14 neg hpv neg        OB History    Gravida Para Term Preterm AB TAB SAB Ectopic Multiple Living   3 3 3       3          Patient Active Problem List   Diagnosis Date Noted  . MYALGIA 02/16/2011    Past Medical History  Diagnosis Date  . Anemia   . Anxiety   . Dyspareunia   . Dysmenorrhea   . Fibroid   . Urinary incontinence     occ. with sneezing    No past surgical history on file.  Current Outpatient Prescriptions  Medication Sig Dispense Refill  . acetaminophen (TYLENOL) 500 MG tablet Take 1,000 mg by mouth daily as needed for headache.     . diphenhydrAMINE (BENADRYL) 25 MG tablet Take 25 mg by mouth at bedtime as needed for sleep.    . naphazoline-pheniramine (NAPHCON-A) 0.025-0.3 % ophthalmic solution Place 1 drop into both eyes daily as needed for irritation or allergies.    . pseudoephedrine (SUDAFED) 30 MG tablet Take 30 mg by mouth daily as needed (sinus pressure).      No current facility-administered medications for this visit.     ALLERGIES: Penicillins  Family History  Problem Relation Age of Onset  . Heart attack Mother   . Breast cancer Mother 67    Estrogen receptive  . Hypertension Mother    . Hyperlipidemia Mother   . Cancer Father 33    Lung ca--dec age 73  . Hypertension Father   . Hyperlipidemia Father   . Cancer Paternal Grandmother 84    colon and pancreatic ca    History   Social History  . Marital Status: Married    Spouse Name: N/A  . Number of Children: N/A  . Years of Education: N/A   Occupational History  . Not on file.   Social History Main Topics  . Smoking status: Never Smoker   . Smokeless tobacco: Never Used  . Alcohol Use: No     Comment: rarely 1-2 drinks/mo.  . Drug Use: No  . Sexual Activity:    Partners: Male    Birth Control/ Protection: Other-see comments     Comment: vasectomy   Other Topics Concern  . Not on file   Social History Narrative    ROS:  Pertinent items are noted in HPI.  PHYSICAL EXAMINATION:    Ht 5\' 7"  (1.702 m)    General appearance: alert, cooperative and appears stated age Head: Normocephalic, without obvious abnormality, atraumatic Neck: no adenopathy, supple, symmetrical, trachea midline and thyroid normal to inspection and palpation Lungs: clear to auscultation bilaterally Heart: regular rate and rhythm Abdomen: soft, non-tender;  bowel sounds normal; Mass to umbilicus, mobile, and nontender.  Extremities: extremities normal, atraumatic, no cyanosis or edema Skin: Skin color, texture, turgor normal. No rashes or lesions Lymph nodes: Cervical, supraclavicular, and axillary nodes normal. No abnormal inguinal nodes palpated Neurologic: Grossly normal  Pelvic: External genitalia:  no lesions              Urethra:  normal appearing urethra with no masses, tenderness or lesions              Bartholins and Skenes: normal                 Vagina: normal appearing vagina with normal color and discharge, no lesions              Cervix: no lesions            Bimanual Exam:  Uterus:  enlarged,  20 weeks size and mobile.               Adnexa: normal adnexa and no mass, fullness, tenderness        Chaperone  was present for exam.  ASSESSMENT  Uterine fibroids.  Intolerance to NSAIDs.  PLAN  Proceed with total abdominal hysterectomy with bilateral salpingectomy.  Risks, benefits, and alternatives discussed with the patient who wishes to proceed.  Risks include but are not limited to bleeding, infection, damage to surrounding organs, reaction to anesthesia, pneumonia, DVT, PE, death, hernia formation, and need for reoperation.  Surgical expectations and recovery discussed. Will do magnesium citrate bowel prep.   An After Visit Summary was printed and given to the patient.  ___25___ minutes face to face time of which over 50% was spent in counseling.

## 2015-03-21 ENCOUNTER — Encounter (HOSPITAL_COMMUNITY): Payer: Self-pay

## 2015-03-21 ENCOUNTER — Encounter (HOSPITAL_COMMUNITY)
Admission: RE | Admit: 2015-03-21 | Discharge: 2015-03-21 | Disposition: A | Discharge status: Home / Self Care | Payer: BLUE CROSS/BLUE SHIELD | Source: Ambulatory Visit | Attending: Obstetrics and Gynecology | Admitting: Obstetrics and Gynecology

## 2015-03-21 DIAGNOSIS — Z01818 Encounter for other preprocedural examination: Secondary | ICD-10-CM | POA: Insufficient documentation

## 2015-03-21 LAB — CBC
HCT: 37 % (ref 36.0–46.0)
HEMOGLOBIN: 11.9 g/dL — AB (ref 12.0–15.0)
MCH: 27.1 pg (ref 26.0–34.0)
MCHC: 32.2 g/dL (ref 30.0–36.0)
MCV: 84.3 fL (ref 78.0–100.0)
Platelets: 328 10*3/uL (ref 150–400)
RBC: 4.39 MIL/uL (ref 3.87–5.11)
RDW: 13.7 % (ref 11.5–15.5)
WBC: 7.4 10*3/uL (ref 4.0–10.5)

## 2015-03-21 LAB — BASIC METABOLIC PANEL
ANION GAP: 7 (ref 5–15)
BUN: 15 mg/dL (ref 6–20)
CALCIUM: 9.1 mg/dL (ref 8.9–10.3)
CO2: 27 mmol/L (ref 22–32)
CREATININE: 0.66 mg/dL (ref 0.44–1.00)
Chloride: 105 mmol/L (ref 101–111)
GFR calc Af Amer: >60 mL/min (ref >60)
GLUCOSE: 92 mg/dL (ref 65–99)
POTASSIUM: 3.9 mmol/L (ref 3.5–5.1)
Sodium: 139 mmol/L (ref 135–145)

## 2015-03-21 NOTE — Patient Instructions (Signed)
   Your procedure is scheduled on: July 26 at [place] through the [place] [facility] at: [place] up the phone at the desk and dial [phone] and inform us of your arrival.  Please call this number if you have any problems the morning of surgery: [phone]  Remember: Do not eat food or drink liquids  after midnight: July 25 (monday)  Do not wear jewelry, make-up, or FINGER nail polish No metal in your hair or on your body. Do not wear lotions, powders, perfumes.  You may wear deodorant.  Do not bring valuables to the hospital. Contacts, dentures or bridgework may not be worn into surgery.  Leave suitcase in the car. After Surgery it may be brought to your room. For patients being admitted to the hospital, checkout time is 11:00am the day of discharge.

## 2015-03-28 MED ORDER — METRONIDAZOLE IN NACL 5-0.79 MG/ML-% IV SOLN
500.0000 mg | INTRAVENOUS | Status: AC
  Administered 2015-03-29: 500 mg via INTRAVENOUS
  Filled 2015-03-28: qty 100

## 2015-03-28 MED ORDER — CIPROFLOXACIN IN D5W 400 MG/200ML IV SOLN
400.0000 mg | INTRAVENOUS | Status: AC
  Administered 2015-03-29: 400 mg via INTRAVENOUS
  Filled 2015-03-28: qty 200

## 2015-03-28 NOTE — Anesthesia Preprocedure Evaluation (Signed)
Anesthesia Evaluation  Patient identified by MRN, date of birth, ID band Patient awake    Reviewed: Allergy & Precautions, H&P , Patient's Chart, lab work & pertinent test results, reviewed documented beta blocker date and time   Airway Mallampati: II  TM Distance: >3 FB Neck ROM: full    Dental no notable dental hx.    Pulmonary  breath sounds clear to auscultation  Pulmonary exam normal       Cardiovascular Rhythm:regular Rate:Normal     Neuro/Psych    GI/Hepatic   Endo/Other    Renal/GU      Musculoskeletal   Abdominal   Peds  Hematology   Anesthesia Other Findings   Reproductive/Obstetrics                             Anesthesia Physical Anesthesia Plan  ASA: II  Anesthesia Plan: General   Post-op Pain Management:    Induction: Intravenous  Airway Management Planned: Oral ETT  Additional Equipment:   Intra-op Plan:   Post-operative Plan: Extubation in OR  Informed Consent: I have reviewed the patients History and Physical, chart, labs and discussed the procedure including the risks, benefits and alternatives for the proposed anesthesia with the patient or authorized representative who has indicated his/her understanding and acceptance.   Dental Advisory Given and Dental advisory given  Plan Discussed with: CRNA and Surgeon  Anesthesia Plan Comments: (  Discussed general anesthesia, including possible nausea, instrumentation of airway, sore throat,pulmonary aspiration, etc. I asked if the were any outstanding questions, or  concerns before we proceeded. )        Anesthesia Quick Evaluation

## 2015-03-28 NOTE — H&P (Signed)
Lynn Cobbs, MD at 03/18/2015 3:33 PM     Status: Signed       Expand All Collapse All   GYNECOLOGY VISIT  HPI: 49 y.o. Married Caucasian female  [phone] with No LMP recorded.  here for Surgical consult for hysterectomy due to uterine fibroids.  Menses are heavy second day.  Feels fibroids are enlarging.  Declines future childbearing.  Feels like she is bloated and with early satiety due to fibroids.   Benign endometrial biopsy in 2014.   Ultrasound 09/09/14 - Uterus with 2 fibroids - 8.37 (right mid to lower uterus) and 1.94 cm (encroaching on endometrial cavity). EMS 12.09 mm. Ovaries normal. No free fluid.   States gastritis with NSAID use.   GYNECOLOGIC HISTORY: No LMP recorded. Contraception:husband vasectomy Menopausal hormone therapy: none Last mammogram: 01-19-15 category b density,birads 1:neg Last pap smear: 03-25-14 neg hpv neg   OB History    Gravida Para Term Preterm AB TAB SAB Ectopic Multiple Living   3 3 3       3        Patient Active Problem List   Diagnosis Date Noted  . MYALGIA 02/16/2011    Past Medical History  Diagnosis Date  . Anemia   . Anxiety   . Dyspareunia   . Dysmenorrhea   . Fibroid   . Urinary incontinence     occ. with sneezing    No past surgical history on file.  Current Outpatient Prescriptions  Medication Sig Dispense Refill  . acetaminophen (TYLENOL) 500 MG tablet Take 1,000 mg by mouth daily as needed for headache.     . diphenhydrAMINE (BENADRYL) 25 MG tablet Take 25 mg by mouth at bedtime as needed for sleep.    . naphazoline-pheniramine (NAPHCON-A) 0.025-0.3 % ophthalmic solution Place 1 drop into both eyes daily as needed for irritation or allergies.    . pseudoephedrine (SUDAFED) 30 MG tablet Take 30 mg by mouth daily as needed (sinus pressure).      No current facility-administered  medications for this visit.     ALLERGIES: Penicillins  Family History  Problem Relation Age of Onset  . Heart attack Mother   . Breast cancer Mother 46    Estrogen receptive  . Hypertension Mother   . Hyperlipidemia Mother   . Cancer Father 8    Lung ca--dec age 7  . Hypertension Father   . Hyperlipidemia Father   . Cancer Paternal Grandmother 35    colon and pancreatic ca    History   Social History  . Marital Status: Married    Spouse Name: N/A  . Number of Children: N/A  . Years of Education: N/A   Occupational History  . Not on file.   Social History Main Topics  . Smoking status: Never Smoker   . Smokeless tobacco: Never Used  . Alcohol Use: No     Comment: rarely 1-2 drinks/mo.  . Drug Use: No  . Sexual Activity:    Partners: Male    Birth Control/ Protection: Other-see comments     Comment: vasectomy   Other Topics Concern  . Not on file   Social History Narrative    ROS: Pertinent items are noted in HPI.  PHYSICAL EXAMINATION:   Ht 5\' 7"  (1.702 m)  General appearance: alert, cooperative and appears stated age Head: Normocephalic, without obvious abnormality, atraumatic Neck: no adenopathy, supple, symmetrical, trachea midline and thyroid normal to inspection and palpation Lungs: clear to auscultation bilaterally Heart:  regular rate and rhythm Abdomen: soft, non-tender; bowel sounds normal; Mass to umbilicus, mobile, and nontender.  Extremities: extremities normal, atraumatic, no cyanosis or edema Skin: Skin color, texture, turgor normal. No rashes or lesions Lymph nodes: Cervical, supraclavicular, and axillary nodes normal. No abnormal inguinal nodes palpated Neurologic: Grossly normal  Pelvic: External genitalia: no lesions  Urethra: normal appearing urethra with no masses, tenderness or  lesions  Bartholins and Skenes: normal   Vagina: normal appearing vagina with normal color and discharge, no lesions  Cervix: no lesions   Bimanual Exam: Uterus: enlarged,  20 weeks size and mobile.   Adnexa: normal adnexa and no mass, fullness, tenderness    Chaperone was present for exam.  ASSESSMENT  Uterine fibroids.  Intolerance to NSAIDs.  PLAN  Proceed with total abdominal hysterectomy with bilateral salpingectomy.  Risks, benefits, and alternatives discussed with the patient who wishes to proceed.  Risks include but are not limited to bleeding, infection, damage to surrounding organs, reaction to anesthesia, pneumonia, DVT, PE, death, hernia formation, and need for reoperation.  Surgical expectations and recovery discussed. Will do magnesium citrate bowel prep.  An After Visit Summary was printed and given to the patient.  ___25___ minutes face to face time of which over 50% was spent in counseling.

## 2015-03-29 ENCOUNTER — Inpatient Hospital Stay (HOSPITAL_COMMUNITY)
Admission: RE | Admit: 2015-03-29 | Discharge: 2015-03-31 | DRG: 743 | Disposition: A | Discharge status: Home / Self Care | Payer: BLUE CROSS/BLUE SHIELD | Source: Ambulatory Visit | Attending: Obstetrics and Gynecology | Admitting: Obstetrics and Gynecology

## 2015-03-29 ENCOUNTER — Encounter (HOSPITAL_COMMUNITY): Payer: Self-pay | Admitting: Emergency Medicine

## 2015-03-29 ENCOUNTER — Inpatient Hospital Stay (HOSPITAL_COMMUNITY): Payer: BLUE CROSS/BLUE SHIELD | Admitting: Anesthesiology

## 2015-03-29 ENCOUNTER — Encounter (HOSPITAL_COMMUNITY)
Admission: RE | Disposition: A | Discharge status: Home / Self Care | Payer: Self-pay | Source: Ambulatory Visit | Attending: Obstetrics and Gynecology

## 2015-03-29 DIAGNOSIS — N946 Dysmenorrhea, unspecified: Secondary | ICD-10-CM | POA: Diagnosis present

## 2015-03-29 DIAGNOSIS — N800 Endometriosis of uterus: Secondary | ICD-10-CM | POA: Diagnosis present

## 2015-03-29 DIAGNOSIS — N920 Excessive and frequent menstruation with regular cycle: Secondary | ICD-10-CM | POA: Diagnosis present

## 2015-03-29 DIAGNOSIS — D259 Leiomyoma of uterus, unspecified: Principal | ICD-10-CM | POA: Diagnosis present

## 2015-03-29 DIAGNOSIS — R6881 Early satiety: Secondary | ICD-10-CM | POA: Diagnosis present

## 2015-03-29 DIAGNOSIS — N941 Dyspareunia: Secondary | ICD-10-CM | POA: Diagnosis present

## 2015-03-29 DIAGNOSIS — Z90710 Acquired absence of both cervix and uterus: Secondary | ICD-10-CM | POA: Diagnosis present

## 2015-03-29 DIAGNOSIS — M791 Myalgia: Secondary | ICD-10-CM | POA: Diagnosis present

## 2015-03-29 DIAGNOSIS — D649 Anemia, unspecified: Secondary | ICD-10-CM | POA: Diagnosis present

## 2015-03-29 DIAGNOSIS — Z888 Allergy status to other drugs, medicaments and biological substances status: Secondary | ICD-10-CM

## 2015-03-29 DIAGNOSIS — F419 Anxiety disorder, unspecified: Secondary | ICD-10-CM | POA: Diagnosis present

## 2015-03-29 HISTORY — PX: ABDOMINAL HYSTERECTOMY: SHX81

## 2015-03-29 HISTORY — PX: CYSTOSCOPY: SHX5120

## 2015-03-29 HISTORY — PX: BILATERAL SALPINGECTOMY: SHX5743

## 2015-03-29 LAB — PREGNANCY, URINE: Preg Test, Ur: NEGATIVE

## 2015-03-29 SURGERY — HYSTERECTOMY, ABDOMINAL
Anesthesia: General | Site: Bladder

## 2015-03-29 MED ORDER — GLYCOPYRROLATE 0.2 MG/ML IJ SOLN
INTRAMUSCULAR | Status: DC | PRN
  Administered 2015-03-29: .6 mg via INTRAVENOUS

## 2015-03-29 MED ORDER — ONDANSETRON HCL 4 MG/2ML IJ SOLN
INTRAMUSCULAR | Status: AC
  Filled 2015-03-29: qty 2

## 2015-03-29 MED ORDER — ONDANSETRON HCL 4 MG/2ML IJ SOLN
4.0000 mg | Freq: Four times a day (QID) | INTRAMUSCULAR | Status: DC | PRN
  Administered 2015-03-29: 4 mg via INTRAVENOUS
  Filled 2015-03-29: qty 2

## 2015-03-29 MED ORDER — DIPHENHYDRAMINE HCL 50 MG/ML IJ SOLN: 12.5000 mg | Freq: Four times a day (QID) | INTRAMUSCULAR | Status: DC | PRN

## 2015-03-29 MED ORDER — LACTATED RINGERS IV SOLN: INTRAVENOUS | Status: DC

## 2015-03-29 MED ORDER — SODIUM CHLORIDE 0.9 % IJ SOLN
INTRAMUSCULAR | Status: AC
  Filled 2015-03-29: qty 50

## 2015-03-29 MED ORDER — LIDOCAINE HCL (CARDIAC) 20 MG/ML IV SOLN
INTRAVENOUS | Status: DC | PRN
  Administered 2015-03-29: 100 mg via INTRAVENOUS

## 2015-03-29 MED ORDER — MIDAZOLAM HCL 2 MG/2ML IJ SOLN
INTRAMUSCULAR | Status: AC
  Filled 2015-03-29: qty 2

## 2015-03-29 MED ORDER — DIPHENHYDRAMINE HCL 12.5 MG/5ML PO ELIX: 12.5000 mg | ORAL_SOLUTION | Freq: Four times a day (QID) | ORAL | Status: DC | PRN

## 2015-03-29 MED ORDER — ACETAMINOPHEN 160 MG/5ML PO SOLN
975.0000 mg | Freq: Once | ORAL | Status: AC
  Administered 2015-03-29: 975 mg via ORAL

## 2015-03-29 MED ORDER — ACETAMINOPHEN 160 MG/5ML PO SOLN
ORAL | Status: AC
  Administered 2015-03-29: 975 mg via ORAL
  Filled 2015-03-29: qty 40.6

## 2015-03-29 MED ORDER — HYDROMORPHONE HCL 1 MG/ML IJ SOLN
0.2500 mg | INTRAMUSCULAR | Status: DC | PRN
  Administered 2015-03-29 (×4): 0.5 mg via INTRAVENOUS

## 2015-03-29 MED ORDER — PROPOFOL 10 MG/ML IV BOLUS
INTRAVENOUS | Status: DC | PRN
  Administered 2015-03-29: 200 mg via INTRAVENOUS

## 2015-03-29 MED ORDER — NALOXONE HCL 0.4 MG/ML IJ SOLN: 0.4000 mg | INTRAMUSCULAR | Status: DC | PRN

## 2015-03-29 MED ORDER — NAPHAZOLINE-PHENIRAMINE 0.025-0.3 % OP SOLN
1.0000 [drp] | Freq: Every day | OPHTHALMIC | Status: DC | PRN
  Filled 2015-03-29: qty 5

## 2015-03-29 MED ORDER — MENTHOL 3 MG MT LOZG: 1.0000 | LOZENGE | OROMUCOSAL | Status: DC | PRN

## 2015-03-29 MED ORDER — GLYCOPYRROLATE 0.2 MG/ML IJ SOLN
INTRAMUSCULAR | Status: AC
  Filled 2015-03-29: qty 3

## 2015-03-29 MED ORDER — BUPIVACAINE HCL (PF) 0.25 % IJ SOLN
INTRAMUSCULAR | Status: AC
  Filled 2015-03-29: qty 30

## 2015-03-29 MED ORDER — FENTANYL CITRATE (PF) 100 MCG/2ML IJ SOLN
INTRAMUSCULAR | Status: AC
  Filled 2015-03-29: qty 2

## 2015-03-29 MED ORDER — SCOPOLAMINE 1 MG/3DAYS TD PT72
MEDICATED_PATCH | TRANSDERMAL | Status: AC
  Administered 2015-03-29: 1.5 mg via TRANSDERMAL
  Filled 2015-03-29: qty 1

## 2015-03-29 MED ORDER — HYDROMORPHONE HCL 1 MG/ML IJ SOLN
INTRAMUSCULAR | Status: AC
  Filled 2015-03-29: qty 1

## 2015-03-29 MED ORDER — SODIUM CHLORIDE 0.9 % IJ SOLN
9.0000 mL | INTRAMUSCULAR | Status: DC | PRN
  Administered 2015-03-30: 3 mL via INTRAVENOUS
  Filled 2015-03-29: qty 9

## 2015-03-29 MED ORDER — DEXAMETHASONE SODIUM PHOSPHATE 10 MG/ML IJ SOLN
INTRAMUSCULAR | Status: DC | PRN
  Administered 2015-03-29: 8 mg via INTRAVENOUS

## 2015-03-29 MED ORDER — PSEUDOEPHEDRINE HCL 30 MG PO TABS: 30.0000 mg | ORAL_TABLET | Freq: Every day | ORAL | Status: DC | PRN

## 2015-03-29 MED ORDER — OXYCODONE-ACETAMINOPHEN 5-325 MG PO TABS: 1.0000 | ORAL_TABLET | ORAL | Status: DC | PRN

## 2015-03-29 MED ORDER — SODIUM CHLORIDE 0.9 % IJ SOLN: 9.0000 mL | INTRAMUSCULAR | Status: DC | PRN

## 2015-03-29 MED ORDER — HYDROMORPHONE 0.3 MG/ML IV SOLN
INTRAVENOUS | Status: DC
  Administered 2015-03-29: 1.79 mg via INTRAVENOUS
  Administered 2015-03-29: 12:00:00 via INTRAVENOUS
  Filled 2015-03-29: qty 25

## 2015-03-29 MED ORDER — ONDANSETRON HCL 4 MG PO TABS: 4.0000 mg | ORAL_TABLET | Freq: Four times a day (QID) | ORAL | Status: DC | PRN

## 2015-03-29 MED ORDER — MIDAZOLAM HCL 2 MG/2ML IJ SOLN
INTRAMUSCULAR | Status: DC | PRN
  Administered 2015-03-29: 2 mg via INTRAVENOUS

## 2015-03-29 MED ORDER — ONDANSETRON HCL 4 MG/2ML IJ SOLN
INTRAMUSCULAR | Status: DC | PRN
  Administered 2015-03-29: 4 mg via INTRAVENOUS

## 2015-03-29 MED ORDER — ROCURONIUM BROMIDE 100 MG/10ML IV SOLN
INTRAVENOUS | Status: AC
  Filled 2015-03-29: qty 1

## 2015-03-29 MED ORDER — LIDOCAINE HCL (CARDIAC) 20 MG/ML IV SOLN
INTRAVENOUS | Status: AC
  Filled 2015-03-29: qty 5

## 2015-03-29 MED ORDER — LACTATED RINGERS IV SOLN
INTRAVENOUS | Status: DC
  Administered 2015-03-29 (×3): via INTRAVENOUS

## 2015-03-29 MED ORDER — NALOXONE HCL 0.4 MG/ML IJ SOLN
0.4000 mg | INTRAMUSCULAR | Status: DC | PRN
Start: 2015-03-29 — End: 2015-03-30

## 2015-03-29 MED ORDER — HYDROMORPHONE 0.3 MG/ML IV SOLN
INTRAVENOUS | Status: DC
  Administered 2015-03-29: 20:00:00 via INTRAVENOUS
  Administered 2015-03-29: 3.39 mg via INTRAVENOUS
  Administered 2015-03-29: 2.79 mg via INTRAVENOUS
  Administered 2015-03-30: 0.3 mg via INTRAVENOUS
  Administered 2015-03-30: 1.39 mg via INTRAVENOUS
  Administered 2015-03-30: 2.39 mg via INTRAVENOUS
  Filled 2015-03-29: qty 25

## 2015-03-29 MED ORDER — STERILE WATER FOR IRRIGATION IR SOLN
Status: DC | PRN
  Administered 2015-03-29: 3000 mL via INTRAVESICAL

## 2015-03-29 MED ORDER — VASOPRESSIN 20 UNIT/ML IV SOLN
INTRAVENOUS | Status: DC | PRN
  Administered 2015-03-29: 10:00:00

## 2015-03-29 MED ORDER — FENTANYL CITRATE (PF) 250 MCG/5ML IJ SOLN
INTRAMUSCULAR | Status: AC
  Filled 2015-03-29: qty 5

## 2015-03-29 MED ORDER — VASOPRESSIN 20 UNIT/ML IV SOLN
INTRAVENOUS | Status: AC
  Filled 2015-03-29: qty 1

## 2015-03-29 MED ORDER — NEOSTIGMINE METHYLSULFATE 10 MG/10ML IV SOLN
INTRAVENOUS | Status: DC | PRN
  Administered 2015-03-29: 3 mg via INTRAVENOUS

## 2015-03-29 MED ORDER — LACTATED RINGERS IV SOLN
INTRAVENOUS | Status: DC
  Administered 2015-03-29 – 2015-03-30 (×2): via INTRAVENOUS

## 2015-03-29 MED ORDER — PROPOFOL 10 MG/ML IV BOLUS
INTRAVENOUS | Status: AC
  Filled 2015-03-29: qty 20

## 2015-03-29 MED ORDER — KETOROLAC TROMETHAMINE 30 MG/ML IJ SOLN
INTRAMUSCULAR | Status: AC
  Filled 2015-03-29: qty 1

## 2015-03-29 MED ORDER — ONDANSETRON HCL 4 MG/2ML IJ SOLN: 4.0000 mg | Freq: Four times a day (QID) | INTRAMUSCULAR | Status: DC | PRN

## 2015-03-29 MED ORDER — NEOSTIGMINE METHYLSULFATE 10 MG/10ML IV SOLN
INTRAVENOUS | Status: AC
  Filled 2015-03-29: qty 1

## 2015-03-29 MED ORDER — SCOPOLAMINE 1 MG/3DAYS TD PT72
1.0000 | MEDICATED_PATCH | Freq: Once | TRANSDERMAL | Status: DC
  Administered 2015-03-29: 1.5 mg via TRANSDERMAL

## 2015-03-29 MED ORDER — FENTANYL CITRATE (PF) 100 MCG/2ML IJ SOLN
INTRAMUSCULAR | Status: DC | PRN
  Administered 2015-03-29: 25 ug via INTRAVENOUS
  Administered 2015-03-29 (×3): 50 ug via INTRAVENOUS
  Administered 2015-03-29: 25 ug via INTRAVENOUS
  Administered 2015-03-29: 50 ug via INTRAVENOUS
  Administered 2015-03-29: 25 ug via INTRAVENOUS
  Administered 2015-03-29: 50 ug via INTRAVENOUS
  Administered 2015-03-29: 25 ug via INTRAVENOUS

## 2015-03-29 MED ORDER — STERILE WATER FOR IRRIGATION IR SOLN
Status: DC | PRN
  Administered 2015-03-29: 1500 mL

## 2015-03-29 MED ORDER — HYDROMORPHONE HCL 1 MG/ML IJ SOLN
INTRAMUSCULAR | Status: DC | PRN
  Administered 2015-03-29 (×2): 0.5 mg via INTRAVENOUS

## 2015-03-29 MED ORDER — ROCURONIUM BROMIDE 100 MG/10ML IV SOLN
INTRAVENOUS | Status: DC | PRN
  Administered 2015-03-29 (×4): 10 mg via INTRAVENOUS
  Administered 2015-03-29: 40 mg via INTRAVENOUS

## 2015-03-29 MED ORDER — DEXAMETHASONE SODIUM PHOSPHATE 10 MG/ML IJ SOLN
INTRAMUSCULAR | Status: AC
  Filled 2015-03-29: qty 1

## 2015-03-29 SURGICAL SUPPLY — 52 items
APL SKNCLS STERI-STRIP NONHPOA (GAUZE/BANDAGES/DRESSINGS) ×3
BENZOIN TINCTURE PRP APPL 2/3 (GAUZE/BANDAGES/DRESSINGS) ×2 IMPLANT
CANISTER SUCT 3000ML (MISCELLANEOUS) ×5 IMPLANT
CELLS DAT CNTRL 66122 CELL SVR (MISCELLANEOUS) IMPLANT
CLOSURE WOUND 1/2 X4 (GAUZE/BANDAGES/DRESSINGS) ×1
CLOTH BEACON ORANGE TIMEOUT ST (SAFETY) ×5 IMPLANT
DECANTER SPIKE VIAL GLASS SM (MISCELLANEOUS) ×3 IMPLANT
DRAPE STERI URO 9X17 APER PCH (DRAPES) ×2 IMPLANT
DRAPE WARM FLUID 44X44 (DRAPE) IMPLANT
DRSG OPSITE POSTOP 4X10 (GAUZE/BANDAGES/DRESSINGS) ×5 IMPLANT
DURAPREP 26ML APPLICATOR (WOUND CARE) ×5 IMPLANT
ELECT BLADE 6.5 EXT (BLADE) ×3 IMPLANT
GAUZE SPONGE 4X4 16PLY XRAY LF (GAUZE/BANDAGES/DRESSINGS) ×2 IMPLANT
GLOVE BIO SURGEON STRL SZ 6.5 (GLOVE) ×12 IMPLANT
GLOVE BIO SURGEONS STRL SZ 6.5 (GLOVE) ×5
GLOVE BIOGEL PI IND STRL 6.5 (GLOVE) IMPLANT
GLOVE BIOGEL PI IND STRL 7.0 (GLOVE) ×6 IMPLANT
GLOVE BIOGEL PI INDICATOR 6.5 (GLOVE) ×4
GLOVE BIOGEL PI INDICATOR 7.0 (GLOVE) ×8
GLOVE ECLIPSE 6.0 STRL STRAW (GLOVE) ×4 IMPLANT
GLOVE INDICATOR 7.0 STRL GRN (GLOVE) ×6 IMPLANT
GLOVE NEODERM STER SZ 7 (GLOVE) ×3 IMPLANT
GLOVE SURG SS PI 7.0 STRL IVOR (GLOVE) ×32 IMPLANT
GOWN STRL REUS W/TWL LRG LVL3 (GOWN DISPOSABLE) ×17 IMPLANT
HEMOSTAT SURGICEL 4X8 (HEMOSTASIS) ×2 IMPLANT
NDL HYPO 25X1 1.5 SAFETY (NEEDLE) IMPLANT
NEEDLE HYPO 25X1 1.5 SAFETY (NEEDLE) ×5 IMPLANT
NS IRRIG 1000ML POUR BTL (IV SOLUTION) ×5 IMPLANT
PACK ABDOMINAL GYN (CUSTOM PROCEDURE TRAY) ×5 IMPLANT
PAD OB MATERNITY 4.3X12.25 (PERSONAL CARE ITEMS) ×5 IMPLANT
RETRACTOR WND ALEXIS 18 MED (MISCELLANEOUS) IMPLANT
RETRACTOR WND ALEXIS 25 LRG (MISCELLANEOUS) ×2 IMPLANT
RTRCTR WOUND ALEXIS 18CM MED (MISCELLANEOUS)
RTRCTR WOUND ALEXIS 25CM LRG (MISCELLANEOUS) ×5
SET CYSTO W/LG BORE CLAMP LF (SET/KITS/TRAYS/PACK) ×5 IMPLANT
SHEET LAVH (DRAPES) ×5 IMPLANT
SPONGE LAP 18X18 X RAY DECT (DISPOSABLE) ×10 IMPLANT
STRIP CLOSURE SKIN 1/2X4 (GAUZE/BANDAGES/DRESSINGS) ×1 IMPLANT
SUT PLAIN 2 0 XLH (SUTURE) ×4 IMPLANT
SUT VIC AB 0 CT1 18XCR BRD8 (SUTURE) ×6 IMPLANT
SUT VIC AB 0 CT1 27 (SUTURE) ×15
SUT VIC AB 0 CT1 27XBRD ANBCTR (SUTURE) ×9 IMPLANT
SUT VIC AB 0 CT1 8-18 (SUTURE) ×20
SUT VIC AB 2-0 CT1 27 (SUTURE) ×15
SUT VIC AB 2-0 CT1 TAPERPNT 27 (SUTURE) ×3 IMPLANT
SUT VIC AB 4-0 PS2 27 (SUTURE) ×12 IMPLANT
SUT VICRYL 0 TIES 12 18 (SUTURE) ×5 IMPLANT
SYR 3ML 23GX1 SAFETY (SYRINGE) ×2 IMPLANT
SYR CONTROL 10ML LL (SYRINGE) ×3 IMPLANT
TOWEL OR 17X24 6PK STRL BLUE (TOWEL DISPOSABLE) ×12 IMPLANT
TRAY FOLEY CATH SILVER 14FR (SET/KITS/TRAYS/PACK) ×2 IMPLANT
WATER STERILE IRR 1000ML POUR (IV SOLUTION) ×5 IMPLANT

## 2015-03-29 NOTE — Transfer of Care (Signed)
Immediate Anesthesia Transfer of Care Note  Patient: Lynn Schmidt  Procedure(s) Performed: Procedure(s): HYSTERECTOMY ABDOMINAL (N/A) BILATERAL SALPINGECTOMY (Bilateral) CYSTOSCOPY (N/A)  Patient Location: PACU  Anesthesia Type:General  Level of Consciousness: awake, alert , oriented and patient cooperative  Airway & Oxygen Therapy: Patient Spontanous Breathing and Patient connected to nasal cannula oxygen  Post-op Assessment: Report given to RN and Post -op Vital signs reviewed and stable  Post vital signs: Reviewed and stable  Last Vitals:  Filed Vitals:   03/29/15 0614  BP: 111/89  Pulse: 85  Temp: 36.6 C  Resp: 16    Complications: No apparent anesthesia complications

## 2015-03-29 NOTE — Anesthesia Procedure Notes (Signed)
Procedure Name: Intubation Date/Time: 03/29/2015 7:40 AM Performed by: Georgeanne Nim Pre-anesthesia Checklist: Patient identified, Patient being monitored, Timeout performed, Emergency Drugs available and Suction available Patient Re-evaluated:Patient Re-evaluated prior to inductionOxygen Delivery Method: Circle system utilized Preoxygenation: Pre-oxygenation with 100% oxygen Intubation Type: IV induction Ventilation: Mask ventilation without difficulty and Oral airway inserted - appropriate to patient size Laryngoscope Size: Mac and 3 Grade View: Grade III Tube type: Oral Number of attempts: 2 Airway Equipment and Method: Video-laryngoscopy (first attemt Mac 3 arytnoids only poor view;opted to use glide with ease and good view) Placement Confirmation: ETT inserted through vocal cords under direct vision,  breath sounds checked- equal and bilateral,  positive ETCO2 and CO2 detector Secured at: 20 cm Tube secured with: Tape Dental Injury: Teeth and Oropharynx as per pre-operative assessment

## 2015-03-29 NOTE — Progress Notes (Signed)
Update to History and Physical  No marked change in status since office preop visit.   Patient examined.   OK to proceed.  

## 2015-03-29 NOTE — Progress Notes (Signed)
Day of Surgery Procedure(s) (LRB): HYSTERECTOMY ABDOMINAL (N/A) BILATERAL SALPINGECTOMY (Bilateral) CYSTOSCOPY (N/A)  Subjective: Patient reports nausea and incisional pain.   Will get up soon.  Objective: I have reviewed patient's vital signs, intake and output and labs. T 97.8, BP 124/71,  P 80, RR 10.  I/O - 3200/1600 cc  General: alert and cooperative Resp: clear to auscultation bilaterally Cardio: regular rate and rhythm, S1, S2 normal, no murmur, click, rub or gallop GI: incision: Bloody drainage - mild on dressing, marked.  Scant bowel sounds, soft, nontender.  and   Extremities: PAS and Ted hose on.  DP 2+ bilaterally.  Vaginal Bleeding: minimal  Assessment: s/p Procedure(s): HYSTERECTOMY ABDOMINAL (N/A) BILATERAL SALPINGECTOMY (Bilateral) CYSTOSCOPY (N/A): stable  Plan:  Dilauded PCA overnight.   CBC and BMP in am.  Discussed surgical findings and procedure.  Questions answered.   LOS: 0 days    Arloa Koh 03/29/2015, 4:53 PM

## 2015-03-29 NOTE — Brief Op Note (Signed)
03/29/2015  10:52 AM  PATIENT:  Lynn Schmidt  49 y.o. female  PRE-OPERATIVE DIAGNOSIS:  menorrhagia, uterine fibroids  POST-OPERATIVE DIAGNOSIS:  menorrhagia, uterine fibroids  PROCEDURE:  Procedure(s): HYSTERECTOMY ABDOMINAL (N/A) BILATERAL SALPINGECTOMY (Bilateral) CYSTOSCOPY (N/A)  SURGEON:  Surgeon(s) and Role:    * Brook E Yisroel Ramming, MD - Primary    * Salvadore Dom, MD - Assisting  PHYSICIAN ASSISTANT:   NA  ASSISTANTS: Salvadore Dom, MD   ANESTHESIA:   general  EBL:  Total I/O In: 2200 [I.V.:2200] Out: 800 [Urine:600; Blood:200]  BLOOD ADMINISTERED:none  DRAINS: Urinary Catheter (Foley)   LOCAL MEDICATIONS USED:  NONE  SPECIMEN:  Source of Specimen:  Uterus, cervix, bilateral tubes.  DISPOSITION OF SPECIMEN:  PATHOLOGY  COUNTS:  YES  TOURNIQUET:  * No tourniquets in log *  DICTATION: .Other Dictation: Dictation Number    PLAN OF CARE: Admit to inpatient   PATIENT DISPOSITION:  PACU - hemodynamically stable.   Delay start of Pharmacological VTE agent (>24hrs) due to surgical blood loss or risk of bleeding: not applicable

## 2015-03-29 NOTE — Anesthesia Postprocedure Evaluation (Signed)
  Anesthesia Post-op Note  Patient: Lynn Schmidt  Procedure(s) Performed: Procedure(s): HYSTERECTOMY ABDOMINAL (N/A) BILATERAL SALPINGECTOMY (Bilateral) CYSTOSCOPY (N/A)  Patient Location: [facility]  Anesthesia Type:General  Level of Consciousness: awake, alert  and oriented  Airway and Oxygen Therapy: Patient Spontanous Breathing  Post-op Pain: none  Post-op Assessment: Post-op Vital signs reviewed and Patient's Cardiovascular Status Stable              Post-op Vital Signs: Reviewed and stable  Last Vitals:  Filed Vitals:   03/29/15 1450  BP: 124/71  Pulse: 80  Temp: 36.6 C  Resp: 10    Complications: No apparent anesthesia complications

## 2015-03-30 ENCOUNTER — Encounter (HOSPITAL_COMMUNITY): Payer: Self-pay | Admitting: Obstetrics and Gynecology

## 2015-03-30 LAB — BASIC METABOLIC PANEL
Anion gap: 6 (ref 5–15)
BUN: 7 mg/dL (ref 6–20)
CHLORIDE: 104 mmol/L (ref 101–111)
CO2: 29 mmol/L (ref 22–32)
Calcium: 8.4 mg/dL — ABNORMAL LOW (ref 8.9–10.3)
Creatinine, Ser: 0.68 mg/dL (ref 0.44–1.00)
Glucose, Bld: 123 mg/dL — ABNORMAL HIGH (ref 65–99)
Potassium: 4.3 mmol/L (ref 3.5–5.1)
Sodium: 139 mmol/L (ref 135–145)

## 2015-03-30 LAB — CBC
HEMATOCRIT: 30.4 % — AB (ref 36.0–46.0)
Hemoglobin: 9.6 g/dL — ABNORMAL LOW (ref 12.0–15.0)
MCH: 27 pg (ref 26.0–34.0)
MCHC: 31.6 g/dL (ref 30.0–36.0)
MCV: 85.4 fL (ref 78.0–100.0)
PLATELETS: 317 10*3/uL (ref 150–400)
RBC: 3.56 MIL/uL — ABNORMAL LOW (ref 3.87–5.11)
RDW: 13.6 % (ref 11.5–15.5)
WBC: 12.6 10*3/uL — AB (ref 4.0–10.5)

## 2015-03-30 MED ORDER — HYDROMORPHONE HCL 2 MG PO TABS
2.0000 mg | ORAL_TABLET | ORAL | Status: DC | PRN
Start: 2015-03-30 — End: 2015-03-30
  Administered 2015-03-30 (×2): 2 mg via ORAL
  Filled 2015-03-30 (×2): qty 1

## 2015-03-30 MED ORDER — ACETAMINOPHEN 500 MG PO TABS
1000.0000 mg | ORAL_TABLET | Freq: Four times a day (QID) | ORAL | Status: DC | PRN
  Administered 2015-03-30: 1000 mg via ORAL
  Filled 2015-03-30: qty 2

## 2015-03-30 MED ORDER — HYDROMORPHONE HCL 2 MG PO TABS
4.0000 mg | ORAL_TABLET | ORAL | Status: DC | PRN
Start: 2015-03-30 — End: 2015-03-31
  Administered 2015-03-30 (×2): 4 mg via ORAL
  Administered 2015-03-30: 2 mg via ORAL
  Administered 2015-03-31 (×3): 4 mg via ORAL
  Filled 2015-03-30 (×6): qty 2

## 2015-03-30 MED ORDER — FAMOTIDINE 20 MG PO TABS
20.0000 mg | ORAL_TABLET | Freq: Every day | ORAL | Status: DC
  Administered 2015-03-30: 20 mg via ORAL
  Filled 2015-03-30: qty 1

## 2015-03-30 NOTE — Progress Notes (Signed)
1 Day Post-Op Procedure(s) (LRB): HYSTERECTOMY ABDOMINAL (N/A) BILATERAL SALPINGECTOMY (Bilateral) CYSTOSCOPY (N/A)  Subjective: Patient reports incisional pain and no problems voiding.   Ambulated.  Objective: I have reviewed patient's vital signs, intake and output and labs. WBC 12.6, Hgb 9.6, Calcium 8.4, Glucose 123.  General: alert and cooperative Resp: clear to auscultation bilaterally Cardio: regular rate and rhythm, S1, S2 normal, no murmur, click, rub or gallop GI: soft, non-tender; bowel sounds normal; no masses,  no organomegaly and incision: Dried blood. Vaginal Bleeding: none  Assessment: s/p Procedure(s): HYSTERECTOMY ABDOMINAL (N/A) BILATERAL SALPINGECTOMY (Bilateral) CYSTOSCOPY (N/A): progressing well  Mild anemia post op.  Plan: Advance diet Encourage ambulation Advance to PO medication  CBC in am tomorrow.  LOS: 1 day    Brook Matthew Saras 03/30/2015, 8:03 AM

## 2015-03-30 NOTE — Op Note (Signed)
NAME:  Lynn Schmidt, REHM NO.:  [id number]  MEDICAL RECORD NO.:  [id number]  LOCATION:  9303                          FACILITY:  [facility]  PHYSICIAN:  Lenard Galloway, M.D.   DATE OF BIRTH:  1966/04/26  DATE OF PROCEDURE:  03/29/2015 DATE OF DISCHARGE:                              OPERATIVE REPORT   PREOPERATIVE DIAGNOSIS:  Uterine fibroids, menorrhagia.  POSTOPERATIVE DIAGNOSIS:  Uterine fibroids, menorrhagia.  PROCEDURE:  Total abdominal hysterectomy with bilateral salpingectomy and cystoscopy.  SURGEON:  Lenard Galloway, M.D.  ASSISTANT:  Salvadore Dom, M.D.  ANESTHESIA:  General endotracheal.  IV FLUIDS:  2200 mL Ringer's lactate.  ESTIMATED BLOOD LOSS:  200 mL.  URINE OUTPUT:  600 mL.  COMPLICATIONS:  None.  INDICATIONS FOR PROCEDURE:  The patient is a 49 year old gravida 87, para 72, Caucasian female who presented with heavy menstrual bleeding and known uterine fibroids.  The patient had been previously cared for by a gynecologic group in [place] where she had a prior ultrasound documenting uterine fibroids.  She had an endometrial biopsy in 2014, which was benign.  The patient is now reporting enlarging uterine fibroids and a desire for hysterectomy.  She declines any future childbearing.  Pelvic ultrasound performed on September 19, 2014, documented a uterus with 2 large fibroids.  The largest was 8.37 cm and located in the right mid-to-lower fundus.  There was a smaller 1.9 cm fibroid encroaching on the endometrial cavity.  The ovaries were unremarkable.  The plan is now made to proceed with a total abdominal hysterectomy with bilateral salpingectomy and bilateral oophorectomy only if the ovaries are abnormal.  Risks, benefits, and alternatives were reviewed with the patient who wished to proceed.  FINDINGS:  The uterine size was equal to 16-week gestation.  There was a large 10 cm right fundal broad ligament fibroid.  The fallopian tubes  and ovaries were unremarkable.  The remaining uterine fundus appeared to be somewhat hypertrophied.  The cervix was unremarkable.  Cystoscopy at termination of the procedure documented the bladder to be intact.  The bladder was visualized throughout 360 degrees including the bladder dome and trigone and was noted to be unremarkable.  There was a small amount of mucus which was floating inside the bladder and there was a tiny piece of fat that was also floating.  It was not possible to retrieve those and remove it at the termination of the procedure.  This piece was probably about 2 mm in size.  The ureters were noted to be patent bilaterally.  The urethra was unremarkable.  No suture was noted in the bladder or the urethra.  SPECIMENS:  The uterus, cervix, and bilateral fallopian tubes were sent to Pathology.  DESCRIPTION OF PROCEDURE:  The patient was reidentified in the preoperative hold area.  She received ciprofloxacin and Flagyl for IV antibiotic prophylaxis.  She received TED hose and PAS stockings for DVT prophylaxis.  The patient was escorted to the operating room where she was placed in the dorsal lithotomy position with Allen stirrups.  General endotracheal anesthesia was then induced.  The patient's lower abdomen and vagina were prepped.  A 10/10 drape  was placed across the rectal area as the patient was eliminating some stool at the beginning of the case.  She was cleansed of this prior to placing the 10/10 drape.  The remaining sterile drapes were then placed.  A Foley catheter was placed inside the urinary bladder and left to gravity drainage throughout the procedure and at the termination of the procedure.  The procedure began by creating a Pfannenstiel incision with a scalpel. The incision was carried down to the fascia using monopolar cautery for hemostasis.  The fascia was incised in the midline and the incision was extended with a Mayo scissors.  The rectus  muscles were dissected off the fascia superiorly and inferiorly.  The rectus muscles were then sharply divided in the midline.  The parietal peritoneum was elevated with 2 hemostat clamps and was entered sharply.  The peritoneal incision was extended cranially and caudally.  The uterus was examined at this time and was noted to fill the pelvis.  An Alexis retractor was placed inside the peritoneal cavity at this time and the patient was placed in the Trendelenburg position.  Two moistened lap pads were used to pack the bowel into the upper abdomen.  The right round ligament, the utero-ovarian ligament, and the proximal fallopian tube were clamped with a long Kelly clamp.  The right round ligament was grasped with a Babcock clamp and elevated.  A transfixing suture of 0 Vicryl was placed in the right round ligament. The round ligament was then divided using monopolar cautery.  The peritoneum of the broad ligament was then taken down anteriorly and posteriorly using a Metzenbaum scissors and using monopolar cautery. The peritoneum was taken down partially along the vesicouterine fold on the patient's right-hand side.  The peritoneum was opened overlying the myoma in the right broad ligament again using the Metzenbaum scissors and monopolar cautery for hemostasis.  This allowed some additional mobility on the patient's right-hand side.  The right fallopian tube was removed at this time.  A window was created in the mesosalpinx on the patient's right-hand side using monopolar cautery.  A Kelly clamp was then placed across the remaining mesosalpinx and blood supply to the fallopian tube.  The specimen was completely excised using monopolar cautery.  The vascular pedicle was tied with a free tie of 0 Vicryl. The right fallopian tube was set aside and was later sent to Pathology along with the left fallopian tube and the uterus and cervix.  A long Kelly clamp was placed across the proximal  left fallopian tube, left utero-ovarian ligament, and the left round ligament at this time. The left round ligament was suture ligated with a transfixing suture of 0 Vicryl.  The round ligament was transected using monopolar cautery. The peritoneum was opened anteriorly and posteriorly using monopolar cautery and the Metzenbaum scissors.  A window was created in the mesosalpinx again using monopolar cautery.  The vascular pedicle to the left fallopian tube was clamped and the fallopian tube was excised using monopolar cautery and was set aside with the pathology specimen.  The vascular pedicle was free tied with 0 Vicryl at this time.  The pedicle was noted to be come loose and was oozing slightly and additional free ties of 0 Vicryl and then 2-0 Vicryl were placed along this pedicle in order to create hemostasis.  The bladder was taken down anteriorly on the patient's left-hand side using monopolar cautery and the Metzenbaum scissors.  The ureter was identified on the patient's left-hand side.  The uterine artery was skeletonized on the patient's left-hand side. The uterine artery was doubly clamped, sharply divided, and was then suture ligated with 0 Vicryl.  The uterine artery branches continued to be clamped with a Heaney clamp, sharply divided, and then suture ligated with 0 Vicryl.  There was an extensive blood supply to the uterus along the cardinal ligament complex.  Hemostasis was good.  Attention was turned back over to the patient's right-hand side.  The uterine fundus and fibroid were shelled out of the overlying peritoneum until the uterine fundus could be elevated adequately on the patient's right-hand side.  Monopolar cautery was used to perform this.  The uterine artery could be skeletonized now on the patient's left-hand side using monopolar cautery.  The bladder flap had been taken down well anteriorly.  The uterine artery was then doubly clamped, sharply divided,  and suture ligated with 0 Vicryl suture.  An additional suture of 0 Vicryl was placed along the same pedicle just as a routine measure for a second suture on this patient's right-hand side.  The remainder of the cardinal ligament was then clamped, sharply divided, and suture ligated with 0 Vicryl bilaterally.  At this time, it was possible to amputate the cervix from the uterine fundus using monopolar cautery and then Mayo scissors.  The fundus was set aside along with the other portions of the pathology specimen.  The cervix was grasped with Kocher clamps at this time.  The bladder was further taken down on the cervix anteriorly.  The inferior aspects of the cardinal ligaments were clamped, sharply divided, and suture ligated with 0 Vicryl bilaterally.  At this time, the uterosacral ligaments could be clamped with curved Heaney clamps.  These specimens were sharply divided and they were suture ligated with transfixing sutures of 0 Vicryl.  A curved Heaney clamp was placed on the left-hand side at this time just above the uterosacral ligament complex.  This pedicle was sharply divided and this allowed entry into the vagina.  The pedicle was suture ligated with a transfixing suture of 0 Vicryl.  The cervix was circumscribed from the vagina at this time using a Jorgenson scissors. The cervix, uterus, and the bilateral fallopian tubes were now all placed together and sent to Pathology.  Modified Richardson angle sutures of 0 Vicryl were then placed bilaterally.  The vaginal cuff was closed with figure-of-eight sutures of 0 Vicryl.  The pelvis was irrigated at this time and was suctioned.  The course of the ureters had each been identified previously including the one on the patient's right-hand side.  There was some minor oozing along the bladder flap anteriorly and this responded well to monopolar cautery. Surgicel was placed across the vaginal cuff and along the bilateral adnexal  regions.  Hemostasis was noted to be good at this time.  The Alexis retractor was removed and the lap pads were removed from the upper abdomen.  The abdomen was closed.  The parietal peritoneum was closed with a running suture of 2-0 Vicryl.  The rectus muscles were re-examined. Some cautery was performed along the pyramidalis muscle inferiorly and along the fascia superiorly where there were some perforating vessels that were oozing slightly.  This provided good hemostasis.  The fascia was closed at this time with a running suture of 0 Vicryl. The subcutaneous layer was irrigated, suctioned, and made hemostatic with monopolar cautery.  Interrupted sutures of 2-0 plain were placed in the subcutaneous layer.  The skin was closed with a  subcuticular suture of 4-0 Vicryl.  Steri-Strips and benzoin were placed over the incision and this was followed by a honeycomb dressing.  Cystoscopy was performed at this time and the findings are as noted above.  The bladder was drained of all cystoscopic fluid and the Foley catheter was replaced and left to gravity drainage.  The vaginal vault was examined and there was good hemostasis along the suture line.  This concluded the patient's procedure.  There were no complications. All needle, instrument, and sponge counts were correct.  The patient is  escorted to the recovery room in stable and awake condition.      Lenard Galloway, M.D.     BES/MEDQ  D:  03/29/2015  T:  03/30/2015  Job:  [id number]

## 2015-03-31 LAB — CBC
HEMATOCRIT: 29 % — AB (ref 36.0–46.0)
Hemoglobin: 9.2 g/dL — ABNORMAL LOW (ref 12.0–15.0)
MCH: 27.4 pg (ref 26.0–34.0)
MCHC: 31.7 g/dL (ref 30.0–36.0)
MCV: 86.3 fL (ref 78.0–100.0)
PLATELETS: 257 10*3/uL (ref 150–400)
RBC: 3.36 MIL/uL — AB (ref 3.87–5.11)
RDW: 14 % (ref 11.5–15.5)
WBC: 9.1 10*3/uL (ref 4.0–10.5)

## 2015-03-31 MED ORDER — HYDROMORPHONE HCL 4 MG PO TABS: 4.0000 mg | ORAL_TABLET | ORAL | Status: DC | PRN

## 2015-03-31 MED ORDER — ACETAMINOPHEN 500 MG PO TABS: 1000.0000 mg | ORAL_TABLET | Freq: Four times a day (QID) | ORAL | Status: AC | PRN

## 2015-03-31 NOTE — Progress Notes (Signed)
Pt is discharged in the care of husband. Downstairs per ambulatory with n.t. Escort. Spirits are good. Denies any pain or discomfort Abdominal incision is claen and dry Pt stated she understands all discharged instructions Questions were asked answered. Stable. No distress Denies any heavy vagibnal bleeding.Marland Kitchen

## 2015-03-31 NOTE — Discharge Instructions (Signed)
Abdominal Hysterectomy, Care After Refer to this sheet in the next few weeks. These instructions provide you with information on caring for yourself after your procedure. Your health care provider may also give you more specific instructions. Your treatment has been planned according to current medical practices, but problems sometimes occur. Call your health care provider if you have any problems or questions after your procedure.  WHAT TO EXPECT AFTER THE PROCEDURE After your procedure, it is typical to have the following:  Pain.  Feeling tired.  Poor appetite.  Less interest in sex. HOME CARE INSTRUCTIONS  It takes 4-6 weeks to recover from this surgery. Make sure you follow all your health care provider's instructions. Home care instructions may include:  Take pain medicines only as directed by your health care provider. Do not take over-the-counter pain medicines without checking with your health care provider first.  Change your bandage as directed by your health care provider.  Return to your health care provider to have your sutures taken out.  Take showers instead of baths for 2-3 weeks. Ask your health care provider when it is safe to start showering.  Do not douche, use tampons, or have sexual intercourse for at least 6 weeks or until your health care provider says you can.   Follow your health care provider's advice about exercise, lifting, driving, and general activities.  Get plenty of rest and sleep.   Do not lift anything heavier than a gallon of milk (about 10 lb [4.5 kg]) for the first month after surgery.  You can resume your normal diet if your health care provider says it is okay.   Do not drink alcohol until your health care provider says you can.   If you are constipated, ask your health care provider if you can take a mild laxative.  Eating foods high in fiber may also help with constipation. Eat plenty of raw fruits and vegetables, whole grains, and  beans.  Drink enough fluids to keep your urine clear or pale yellow.   Try to have someone at home with you for the first 1-2 weeks to help around the house.  Keep all follow-up appointments. SEEK MEDICAL CARE IF:   You have chills or fever.  You have swelling, redness, or pain in the area of your incision that is getting worse.   You have pus coming from the incision.   You notice a bad smell coming from the incision or bandage.   Your incision breaks open.   You feel dizzy or light-headed.   You have pain or bleeding when you urinate.   You have persistent diarrhea.   You have persistent nausea and vomiting.   You have abnormal vaginal discharge.   You have a rash.   You have any type of abnormal reaction or develop an allergy to your medicine.   Your pain medicine is not helping.  SEEK IMMEDIATE MEDICAL CARE IF:   You have a fever and your symptoms suddenly get worse.  You have severe abdominal pain.  You have chest pain.  You have shortness of breath.  You faint.  You have pain, swelling, or redness of your leg.  You have heavy vaginal bleeding with blood clots. MAKE SURE YOU:  Understand these instructions.  Will watch your condition.  Will get help right away if you are not doing well or get worse. Document Released: 03/09/2005 Document Revised: 08/25/2013 Document Reviewed: 06/12/2013 Exit[employer] Patient Information 2015 [employer]. This information is not intended   to replace advice given to you by your health care provider. Make sure you discuss any questions you have with your health care provider.  

## 2015-03-31 NOTE — Progress Notes (Signed)
2 Days Post-Op Procedure(s) (LRB): HYSTERECTOMY ABDOMINAL (N/A) BILATERAL SALPINGECTOMY (Bilateral) CYSTOSCOPY (N/A)  Subjective: Patient reports incisional pain and tolerating PO.   Ambulating.  Ready for discharge.  Objective: I have reviewed patient's vital signs, intake and output, labs and pathology. T 98.9, BP 104/60, P 80, RR 18 I/0 - 480 cc/750 cc WBC 9.1 Hgb 9.2 Pathology - leiomyoma, adenomyosis  General: alert and cooperative Resp: clear to auscultation bilaterally Cardio: regular rate and rhythm, S1, S2 normal, no murmur, click, rub or gallop GI: soft, non-tender; bowel sounds normal; no masses,  no organomegaly and incision: Dired blood on the steristrips. Vaginal Bleeding: none  Assessment: s/p Procedure(s): HYSTERECTOMY ABDOMINAL (N/A) BILATERAL SALPINGECTOMY (Bilateral) CYSTOSCOPY (N/A): stable and progressing well Ready for discharge.  Plan: Discharge home  Rx for Dilaudid.  Can also take Tylenol.  FeSO4 po bid with meals.  Pathology report discussed. Instructions and precautions reviewed.  Follow up in 4 days.    LOS: 2 days    Arloa Koh 03/31/2015, 7:59 AM

## 2015-04-04 ENCOUNTER — Ambulatory Visit (INDEPENDENT_AMBULATORY_CARE_PROVIDER_SITE_OTHER): Payer: BLUE CROSS/BLUE SHIELD | Admitting: Obstetrics and Gynecology

## 2015-04-04 ENCOUNTER — Encounter: Payer: Self-pay | Admitting: Obstetrics and Gynecology

## 2015-04-04 VITALS — BP 110/70 | HR 88 | Ht 67.0 in | Wt 165.4 lb

## 2015-04-04 DIAGNOSIS — Z9889 Other specified postprocedural states: Secondary | ICD-10-CM

## 2015-04-04 NOTE — Progress Notes (Signed)
Patient ID: Lynn Schmidt, female   DOB: 24-Mar-1966, 49 y.o.   MRN: [id number] GYNECOLOGY  VISIT   HPI: 49 y.o.   Married  Caucasian  female   [phone] with Patient's last menstrual period was 02/25/2015.   here for 1 week post   HYSTERECTOMY ABDOMINAL (N/A Abdomen)  BILATERAL SALPINGECTOMY (Bilateral Abdomen)  CYSTOSCOPY (N/A Bladder).   Some bloating and abdominal wall soreness.  Ambulated a lot in the community this last weekend.  Feels like everything is a little loose inside.  Taking Dilaudid at night.  Has plenty.  Taking Tylenol during the day.  No bleeding.  Voiding OK just slower.  Last Bm was this am.   Hgb 9.2 at discharge.  Taking iron once daily.   Path - fibroids and adenomyosis.   GYNECOLOGIC HISTORY: Patient's last menstrual period was 02/25/2015. Contraception: Vasectomy/Hysterectomy Menopausal hormone therapy: n/a Last mammogram: 01-19-15  Last pap smear: 03/2014 negative        OB History    Gravida Para Term Preterm AB TAB SAB Ectopic Multiple Living   3 3 3       3          Patient Active Problem List   Diagnosis Date Noted  . Status post total abdominal hysterectomy 03/29/2015  . MYALGIA 02/16/2011    Past Medical History  Diagnosis Date  . Anemia   . Anxiety   . Dyspareunia   . Dysmenorrhea   . Fibroid   . Urinary incontinence     occ. with sneezing    Past Surgical History  Procedure Laterality Date  . Abdominal hysterectomy N/A 03/29/2015    Procedure: HYSTERECTOMY ABDOMINAL;  Surgeon: Nunzio Cobbs, MD;  Location: [facility];  Service: Gynecology;  Laterality: N/A;  . Bilateral salpingectomy Bilateral 03/29/2015    Procedure: BILATERAL SALPINGECTOMY;  Surgeon: Nunzio Cobbs, MD;  Location: [facility];  Service: Gynecology;  Laterality: Bilateral;  . Cystoscopy N/A 03/29/2015    Procedure: CYSTOSCOPY;  Surgeon: Nunzio Cobbs, MD;  Location: [facility];  Service: Gynecology;  Laterality: N/A;    Current Outpatient  Prescriptions  Medication Sig Dispense Refill  . acetaminophen (TYLENOL) 500 MG tablet Take 2 tablets (1,000 mg total) by mouth every 6 (six) hours as needed for mild pain. 30 tablet 0  . HYDROmorphone (DILAUDID) 4 MG tablet Take 1 tablet (4 mg total) by mouth every 3 (three) hours as needed for severe pain. 30 tablet 0  . naphazoline-pheniramine (NAPHCON-A) 0.025-0.3 % ophthalmic solution Place 1 drop into both eyes daily as needed for irritation or allergies.    . pseudoephedrine (SUDAFED) 30 MG tablet Take 30 mg by mouth daily as needed (sinus pressure).      No current facility-administered medications for this visit.     ALLERGIES: Asa; Ibuprofen; and Penicillins  Family History  Problem Relation Age of Onset  . Heart attack Mother   . Breast cancer Mother 42    Estrogen receptive  . Hypertension Mother   . Hyperlipidemia Mother   . Cancer Father 37    Lung ca--dec age 72  . Hypertension Father   . Hyperlipidemia Father   . Cancer Paternal Grandmother 3    colon and pancreatic ca    History   Social History  . Marital Status: Married    Spouse Name: N/A  . Number of Children: N/A  . Years of Education: N/A   Occupational History  . Not on file.  Social History Main Topics  . Smoking status: Never Smoker   . Smokeless tobacco: Never Used  . Alcohol Use: No  . Drug Use: No  . Sexual Activity:    Partners: Male    Birth Control/ Protection: Other-see comments     Comment: vasectomy/TAH/Bil.salpingectomy   Other Topics Concern  . Not on file   Social History Narrative    ROS:  Pertinent items are noted in HPI.  PHYSICAL EXAMINATION:    BP 110/70 mmHg  Pulse 88  Ht 5\' 7"  (1.702 m)  Wt 165 lb 6.4 oz (75.025 kg)  BMI 25.90 kg/m2  LMP 02/25/2015    General appearance: alert, cooperative and appears stated age   Abdomen: Pfannenstiel incision intact, soft, non-tender; bowel sounds normal; no masses,  no organomegaly  ASSESSMENT  Status post  TAH/bilateral salpingectomy for fibroids and adenomyosis. Doing well overall.    PLAN  Counseled regarding post op care.  Decrease ambulation a little.  Dicussed abdominal binder.  Take steristrips off at 10 - 14 days post op.  Follow up in 5 weeks for post op visit.   An After Visit Summary was printed and given to the patient.  _15_____ minutes face to face time of which over 50% was spent in counseling.

## 2015-04-05 NOTE — Discharge Summary (Signed)
Physician Discharge Summary  Patient ID: Lynn Schmidt MRN: [id number] DOB/AGE: [birth year] 49 y.o.  Admit date: 03/29/2015 Discharge date:  03/31/15  Admission Diagnosis: Uterine fibroids  Discharge Diagnoses:  1.  Uterine fibroids.  2.  Status post total abdominal hysterectomy with bilateral salpingectomy, cystoscopy.  Active Problems:   Status post total abdominal hysterectomy   Discharged Condition: good  Hospital Course:  The patient was admitted on 03/29/15 for a total abdominal hysterectomy with bilateral salpingectomy and cystoscopy which were performed without complication while under general anesthesia.  The patient's post op course was uneventful.  She had a dilaudid PCA  for pain control initially, and this was converted over to oral Dilaudid and Tylenol on post op day one when the patient began taking po well.  She ambulated independently and wore PAS and Ted hose for DVT prophylaxis while in bed.  Her foley catheter were removed on post op day one, and she voided good volumes. The patient's vital signs remained stable and she demonstrated no signs of infection during her hospitalization.  The patient's post op day one Hgb was 9.6 and post op day two Hgb was 9.2.   She was tolerating the this well.  She had very minimal vaginal bleeding, and her incision(s) demonstrated no signs of erythema or significant drainage.  She was found to be in good condition and ready for discharge on post op day two.     Consults: None  Significant Diagnostic Studies: labs:  See Hospital Course.  Final pathology - leiomyoma and adenomyosis.   Treatments: surgery:  Total abdominal hysterectomy with bilateral salpingectomy and cystoscopy on 03/29/15.  Discharge Exam: Blood pressure 104/60, pulse 80, temperature 98.9 F (37.2 C), temperature source Oral, resp. rate 18, height 5\' 7"  (1.702 m), weight 170 lb (77.111 kg), last menstrual period 03/25/2015, SpO2 100 %. General: alert and  cooperative Resp: clear to auscultation bilaterally Cardio: regular rate and rhythm, S1, S2 normal, no murmur, click, rub or gallop GI: soft, non-tender; bowel sounds normal; no masses, no organomegaly and incision: Dried blood on the steristrips. Vaginal Bleeding: none  Disposition: 01-Home or Self Care Discharge instructions given in verbal and written form.  patient will take FeSO4 325 mg po bid with meals for anemia.     Medication List    STOP taking these medications        diphenhydrAMINE 25 MG tablet  Commonly known as:  BENADRYL      TAKE these medications        acetaminophen 500 MG tablet  Commonly known as:  TYLENOL  Take 2 tablets (1,000 mg total) by mouth every 6 (six) hours as needed for mild pain.     HYDROmorphone 4 MG tablet  Commonly known as:  DILAUDID  Take 1 tablet (4 mg total) by mouth every 3 (three) hours as needed for severe pain.     naphazoline-pheniramine 0.025-0.3 % ophthalmic solution  Commonly known as:  NAPHCON-A  Place 1 drop into both eyes daily as needed for irritation or allergies.     pseudoephedrine 30 MG tablet  Commonly known as:  SUDAFED  Take 30 mg by mouth daily as needed (sinus pressure).           Follow-up Information    Follow up with Arloa Koh, MD In 4 days.   Specialty:  Obstetrics and Gynecology   Contact information:   [address] [place] [address] [phone]  Signed: Arloa Koh 04/05/2015, 8:00 PM

## 2015-04-08 ENCOUNTER — Other Ambulatory Visit: Payer: Self-pay | Admitting: Obstetrics and Gynecology

## 2015-04-08 ENCOUNTER — Telehealth: Payer: Self-pay | Admitting: Obstetrics and Gynecology

## 2015-04-08 MED ORDER — TRAMADOL HCL 50 MG PO TABS: 50.0000 mg | ORAL_TABLET | Freq: Four times a day (QID) | ORAL | Status: DC | PRN

## 2015-04-08 NOTE — Telephone Encounter (Signed)
I believe that I mentioned Percocet to the patient.  This may also cause sleepiness.   I would recommend Tramadol 50 mg po q 6 hours as needed for pain. #30, RF zero. I will print Rx. I think that this will have less sedating effects for her but be much stronger than the Tylenol. It can be used together with Tylenol.

## 2015-04-08 NOTE — Telephone Encounter (Signed)
Spoke with patient. Patient states she was given Dilaudid post operatively. Has been taking Dilaudid nightly for pain relief. Has been taking Tylenol every 4-6 hours daily for right sided pain. States the Tylenol helps some but not like the Dilaudid. Unable to take the Dilaudid during the day due to it making her sleepy. States she spoke with Dr.Silva regarding a different medication for pain relief during the day and would like to have this. Does not recall the name of the medication. Advised I will speak with Dr.Silva regarding medication and return call.

## 2015-04-08 NOTE — Telephone Encounter (Signed)
Spoke with patient. Advised of message as seen below from [facility] [place]. Patient is agreeable and verbalizes understanding. Rx for Tramadol 50 mg #30 [id number] faxed to [facility] with cover sheet and confirmation at [phone].  Routing to provider for final review. Patient agreeable to disposition. Will close encounter.

## 2015-04-08 NOTE — Telephone Encounter (Signed)
Left message to call Doniqua Saxby at [phone]. 

## 2015-04-08 NOTE — Telephone Encounter (Signed)
Patient to change her medication.

## 2015-04-18 DIAGNOSIS — Z0289 Encounter for other administrative examinations: Secondary | ICD-10-CM

## 2015-04-27 ENCOUNTER — Ambulatory Visit (INDEPENDENT_AMBULATORY_CARE_PROVIDER_SITE_OTHER): Payer: BLUE CROSS/BLUE SHIELD | Admitting: Obstetrics and Gynecology

## 2015-04-27 ENCOUNTER — Encounter: Payer: Self-pay | Admitting: Obstetrics and Gynecology

## 2015-04-27 ENCOUNTER — Telehealth: Payer: Self-pay | Admitting: Obstetrics and Gynecology

## 2015-04-27 VITALS — BP 110/74 | HR 90 | Temp 97.4°F | Ht 67.0 in | Wt 165.6 lb

## 2015-04-27 DIAGNOSIS — T814XXA Infection following a procedure, initial encounter: Secondary | ICD-10-CM

## 2015-04-27 DIAGNOSIS — IMO0001 Reserved for inherently not codable concepts without codable children: Secondary | ICD-10-CM

## 2015-04-27 MED ORDER — CEPHALEXIN 250 MG PO CAPS: ORAL_CAPSULE | ORAL | Status: DC

## 2015-04-27 MED ORDER — FLUCONAZOLE 150 MG PO TABS: 150.0000 mg | ORAL_TABLET | Freq: Every day | ORAL | Status: DC

## 2015-04-27 NOTE — Patient Instructions (Signed)
Take the Keflex 25 mg by mouth four times a day for one week.  There is about a 10% chance you could develop an allergic reaction to this.  If you have facial swelling, tongue swelling, rash, or shortness of breath, stop the Keflex and take benadryl 25 - 50 mg by mouth every 6 hours and seek urgent care.  Take the Diflucan 150 mg if you develop a yeast infection.    I will see you for your 6 week post op visit.

## 2015-04-27 NOTE — Telephone Encounter (Signed)
I will see patient today for an incision check. Discussed with Lynn Schmidt that the patient will come in now.

## 2015-04-27 NOTE — Telephone Encounter (Signed)
Spoke with patient. Patient had an abdominal hysterectomy on 03/29/2015. Patient states that yesterday her incision became tender. States that the incision looks very well except for the left end which is red and swollen. "It looks almost like an inflamed red pimple." The area is tender and leaking "fluid and pus." Denies any fevers or chills. Advised will need to speak with Dr.Silva regarding symptoms and return call. Patient is agreeable.

## 2015-04-27 NOTE — Telephone Encounter (Signed)
Patient has a question regarding her incision.

## 2015-04-27 NOTE — Telephone Encounter (Signed)
Spoke with patient and advised to come for office visit. She will come now, per patient will around around 2:15.  Will close encounter.

## 2015-04-27 NOTE — Progress Notes (Signed)
Patient ID: Lynn Schmidt, female   DOB: 1966/07/01, 49 y.o.   MRN: [id number] GYNECOLOGY  VISIT   HPI: 49 y.o.   Married  Caucasian  female   [phone] with Patient's last menstrual period was 02/25/2015.   here for incision check.  Patient had abdominal hysterectomy on 7- 26-16 and yesterday began having tenderness at incision site and today the left side of incision is red and draining. Put some triple abx cream on it.  No fevers.  No new exposures to skin.   Traveling to bring son to school in two days.   PCN rash as a child.  Does not have hx of anaphylaxis to PCN.  Has not taken cephalosporins.   GYNECOLOGIC HISTORY: Patient's last menstrual period was 02/25/2015. Contraception:Vasectomy/Hysterectomy Menopausal hormone therapy: none Last mammogram: 01-19-15 Density Cat.B/Neg:[facility]. Last pap smear: 03-25-14 Neg:Neg HR HPV        OB History    Gravida Para Term Preterm AB TAB SAB Ectopic Multiple Living   3 3 3       3          Patient Active Problem List   Diagnosis Date Noted  . Status post total abdominal hysterectomy 03/29/2015  . MYALGIA 02/16/2011    Past Medical History  Diagnosis Date  . Anemia   . Anxiety   . Dyspareunia   . Dysmenorrhea   . Fibroid   . Urinary incontinence     occ. with sneezing    Past Surgical History  Procedure Laterality Date  . Abdominal hysterectomy N/A 03/29/2015    Procedure: HYSTERECTOMY ABDOMINAL;  Surgeon: Nunzio Cobbs, MD;  Location: [facility];  Service: Gynecology;  Laterality: N/A;  . Bilateral salpingectomy Bilateral 03/29/2015    Procedure: BILATERAL SALPINGECTOMY;  Surgeon: Nunzio Cobbs, MD;  Location: [facility];  Service: Gynecology;  Laterality: Bilateral;  . Cystoscopy N/A 03/29/2015    Procedure: CYSTOSCOPY;  Surgeon: Nunzio Cobbs, MD;  Location: [facility];  Service: Gynecology;  Laterality: N/A;    Current Outpatient Prescriptions  Medication Sig Dispense Refill  . acetaminophen  (TYLENOL) 500 MG tablet Take 2 tablets (1,000 mg total) by mouth every 6 (six) hours as needed for mild pain. 30 tablet 0  . naphazoline-pheniramine (NAPHCON-A) 0.025-0.3 % ophthalmic solution Place 1 drop into both eyes daily as needed for irritation or allergies.    . pseudoephedrine (SUDAFED) 30 MG tablet Take 30 mg by mouth daily as needed (sinus pressure).     . traMADol (ULTRAM) 50 MG tablet Take 1 tablet (50 mg total) by mouth every 6 (six) hours as needed. 30 tablet 0   No current facility-administered medications for this visit.     ALLERGIES: Asa; Ibuprofen; and Penicillins  Family History  Problem Relation Age of Onset  . Heart attack Mother   . Breast cancer Mother 54    Estrogen receptive  . Hypertension Mother   . Hyperlipidemia Mother   . Cancer Father 52    Lung ca--dec age 67  . Hypertension Father   . Hyperlipidemia Father   . Cancer Paternal Grandmother 95    colon and pancreatic ca    Social History   Social History  . Marital Status: Married    Spouse Name: N/A  . Number of Children: N/A  . Years of Education: N/A   Occupational History  . Not on file.   Social History Main Topics  . Smoking status: Never Smoker   .  Smokeless tobacco: Never Used  . Alcohol Use: No  . Drug Use: No  . Sexual Activity:    Partners: Male    Birth Control/ Protection: Other-see comments     Comment: vasectomy/TAH/Bil.salpingectomy   Other Topics Concern  . Not on file   Social History Narrative    ROS:  Pertinent items are noted in HPI.  PHYSICAL EXAMINATION:    BP 110/74 mmHg  Pulse 90  Temp(Src) 97.4 F (36.3 C) (Oral)  Ht 5\' 7"  (1.702 m)  Wt 165 lb 9.6 oz (75.116 kg)  BMI 25.93 kg/m2  LMP 02/25/2015    General appearance: alert, cooperative and appears stated age    Abdomen: Incision with 1 cm area of erythema and knot present at left apex.  Cleansed with H2O2 and know excised.  Minimal erythema of the rest of the incision.  Abdomen soft,  non-tender; bowel sounds normal; no masses,  no organomegaly   Chaperone was present for exam.  ASSESSMENT   Stitch abscess.  It does not look like generalized cellulitis but patient is concerned about this with her plans to travel.  PCN allergy - low risk.   PLAN  Cleanse with H2O2 daily and use antibacterial soap.  Keflex 250 mg po qid for 7 days.  Discussed 10% cross reactivity.  Have benadryl available.  Follow up in about 1 week, sooner as needed if progresses to increased drainage, redness, or fever.   An After Visit Summary was printed and given to the patient.  ___15___ minutes face to face time of which over 50% was spent in counseling.

## 2015-05-05 ENCOUNTER — Ambulatory Visit (INDEPENDENT_AMBULATORY_CARE_PROVIDER_SITE_OTHER): Payer: BLUE CROSS/BLUE SHIELD | Admitting: Obstetrics and Gynecology

## 2015-05-05 ENCOUNTER — Encounter: Payer: Self-pay | Admitting: *Deleted

## 2015-05-05 ENCOUNTER — Encounter: Payer: Self-pay | Admitting: Obstetrics and Gynecology

## 2015-05-05 VITALS — BP 106/68 | HR 80 | Resp 16 | Ht 67.0 in | Wt 163.0 lb

## 2015-05-05 DIAGNOSIS — Z9889 Other specified postprocedural states: Secondary | ICD-10-CM

## 2015-05-05 DIAGNOSIS — Z90710 Acquired absence of both cervix and uterus: Secondary | ICD-10-CM

## 2015-05-05 NOTE — Progress Notes (Signed)
GYNECOLOGY  VISIT   HPI: 49 y.o.   Married  Caucasian  female   [phone] with Patient's last menstrual period was 02/25/2015.   here for Post op from 03/29/15: HYSTERECTOMY ABDOMINAL      BILATERAL SALPINGECTOMY     CYSTOSCOPY          Has stomach cramping with Keflex which she is taking for stitch abscess of her Pfannenstiel incision.  Not able to complete abx yet.  No allergy to abx.  Overall feeling well.   GYNECOLOGIC HISTORY: Patient's last menstrual period was 02/25/2015. Contraception: hysterectomy  Menopausal hormone therapy: None Last mammogram: 01/19/15 BIRADS1:neg Last pap smear: 03/25/14 neg. HR HPV: Neg.         OB History    Gravida Para Term Preterm AB TAB SAB Ectopic Multiple Living   3 3 3       3          Patient Active Problem List   Diagnosis Date Noted  . Status post total abdominal hysterectomy 03/29/2015  . MYALGIA 02/16/2011    Past Medical History  Diagnosis Date  . Anemia   . Anxiety   . Dyspareunia   . Dysmenorrhea   . Fibroid   . Urinary incontinence     occ. with sneezing    Past Surgical History  Procedure Laterality Date  . Abdominal hysterectomy N/A 03/29/2015    Procedure: HYSTERECTOMY ABDOMINAL;  Surgeon: Nunzio Cobbs, MD;  Location: [facility];  Service: Gynecology;  Laterality: N/A;  . Bilateral salpingectomy Bilateral 03/29/2015    Procedure: BILATERAL SALPINGECTOMY;  Surgeon: Nunzio Cobbs, MD;  Location: [facility];  Service: Gynecology;  Laterality: Bilateral;  . Cystoscopy N/A 03/29/2015    Procedure: CYSTOSCOPY;  Surgeon: Nunzio Cobbs, MD;  Location: [facility];  Service: Gynecology;  Laterality: N/A;    Current Outpatient Prescriptions  Medication Sig Dispense Refill  . acetaminophen (TYLENOL) 500 MG tablet Take 2 tablets (1,000 mg total) by mouth every 6 (six) hours as needed for mild pain. 30 tablet 0  . cephALEXin (KEFLEX) 250 MG capsule Take one capsule (250 mg) by mouth four times a day for 7  days. 28 capsule 0  . fluconazole (DIFLUCAN) 150 MG tablet Take 1 tablet (150 mg total) by mouth daily. Take second tablet is symptoms persist after 48 hours. 2 tablet 0  . naphazoline-pheniramine (NAPHCON-A) 0.025-0.3 % ophthalmic solution Place 1 drop into both eyes daily as needed for irritation or allergies.    . pseudoephedrine (SUDAFED) 30 MG tablet Take 30 mg by mouth daily as needed (sinus pressure).     . traMADol (ULTRAM) 50 MG tablet Take 1 tablet (50 mg total) by mouth every 6 (six) hours as needed. 30 tablet 0   No current facility-administered medications for this visit.     ALLERGIES: Asa; Ibuprofen; and Penicillins  Family History  Problem Relation Age of Onset  . Heart attack Mother   . Breast cancer Mother 59    Estrogen receptive  . Hypertension Mother   . Hyperlipidemia Mother   . Cancer Father 52    Lung ca--dec age 60  . Hypertension Father   . Hyperlipidemia Father   . Cancer Paternal Grandmother 45    colon and pancreatic ca    Social History   Social History  . Marital Status: Married    Spouse Name: N/A  . Number of Children: N/A  . Years of Education: N/A  Occupational History  . Not on file.   Social History Main Topics  . Smoking status: Never Smoker   . Smokeless tobacco: Never Used  . Alcohol Use: No  . Drug Use: No  . Sexual Activity:    Partners: Male    Birth Control/ Protection: Other-see comments     Comment: vasectomy/TAH/Bil.salpingectomy   Other Topics Concern  . Not on file   Social History Narrative    ROS:  Pertinent items are noted in HPI.  PHYSICAL EXAMINATION:    BP 106/68 mmHg  Pulse 80  Resp 16  Ht 5\' 7"  (1.702 m)  Wt 163 lb (73.936 kg)  BMI 25.52 kg/m2  LMP 02/25/2015    General appearance: alert, cooperative and appears stated age Abdomen: Pfannenstiel intact.  Left apex closed and no abscess noted.  No erythema or induration or tenderness.  Abdomen soft, non-tender; bowel sounds normal; no masses,  no  organomegaly   Pelvic: External genitalia:  no lesions              Urethra:  normal appearing urethra with no masses, tenderness or lesions              Bartholins and Skenes: normal                 Vagina: normal appearing vagina with normal color and discharge, no lesions.  Cuff intact.               Cervix: absent                Bimanual Exam:  Uterus:  uterus absent              Adnexa: normal adnexa and no mass, fullness, tenderness                Chaperone was present for exam.  ASSESSMENT  Status post TAH/bilateral salpingectomy, cystoscopy.  Incision well healed.   PLAN  OK to stop abx. Counseled regarding return to work and all normal activities.  Follow up for annual exams and prn.    An After Visit Summary was printed and given to the patient.

## 2015-05-09 ENCOUNTER — Encounter: Payer: Self-pay | Admitting: Obstetrics and Gynecology

## 2015-05-16 ENCOUNTER — Ambulatory Visit: Payer: BLUE CROSS/BLUE SHIELD | Admitting: Obstetrics and Gynecology

## 2015-08-05 ENCOUNTER — Encounter: Payer: Self-pay | Admitting: Obstetrics and Gynecology

## 2016-03-12 ENCOUNTER — Encounter: Payer: Self-pay | Admitting: Obstetrics and Gynecology

## 2016-12-25 ENCOUNTER — Telehealth: Payer: Self-pay | Admitting: Obstetrics and Gynecology

## 2016-12-25 NOTE — Telephone Encounter (Signed)
Patient having lower abdominal pain near her ovaries.

## 2016-12-25 NOTE — Telephone Encounter (Signed)
Spoke with patient. Patient states she called to schedule AEX. Patient states she mentioned she has been experiencing some occassional pains at left ovary location over the past year. Patient states pain is more around last 3 days of when she would be ovulating. Patient is s/p abdominal hysterectomy and bilateral salpingectomy (03/29/15). Patient states she would just like to have this checked out, denies any other complaints. Patient scheduled by front by front office staff, would like to see if anything earlier than 5/14? Patient scheduled for 12/27/16 at 2:30pm for AEX with Dr. Quincy Simmonds. Advised patient should pain worsen, fever or nausea/vomiting develop return call to office or visit local ER after hours for evaluation. Patient verbalizes understanding and is agreeable. Last OV 05/05/15.  Routing to provider for final review. Patient is agreeable to disposition. Will close encounter.

## 2016-12-27 ENCOUNTER — Ambulatory Visit (INDEPENDENT_AMBULATORY_CARE_PROVIDER_SITE_OTHER): Payer: BLUE CROSS/BLUE SHIELD | Admitting: Obstetrics and Gynecology

## 2016-12-27 ENCOUNTER — Encounter: Payer: Self-pay | Admitting: Obstetrics and Gynecology

## 2016-12-27 VITALS — BP 104/66 | HR 90 | Resp 16 | Ht 67.0 in | Wt 153.0 lb

## 2016-12-27 DIAGNOSIS — Z01419 Encounter for gynecological examination (general) (routine) without abnormal findings: Secondary | ICD-10-CM

## 2016-12-27 LAB — CBC
HCT: 40 % (ref 35.0–45.0)
Hemoglobin: 13.1 g/dL (ref 11.7–15.5)
MCH: 29.1 pg (ref 27.0–33.0)
MCHC: 32.8 g/dL (ref 32.0–36.0)
MCV: 88.9 fL (ref 80.0–100.0)
MPV: 9.4 fL (ref 7.5–12.5)
PLATELETS: 376 10*3/uL (ref 140–400)
RBC: 4.5 MIL/uL (ref 3.80–5.10)
RDW: 13 % (ref 11.0–15.0)
WBC: 8.8 10*3/uL (ref 3.8–10.8)

## 2016-12-27 LAB — COMPREHENSIVE METABOLIC PANEL
ALBUMIN: 4.2 g/dL (ref 3.6–5.1)
ALK PHOS: 44 U/L (ref 33–130)
ALT: 23 U/L (ref 6–29)
AST: 21 U/L (ref 10–35)
BUN: 13 mg/dL (ref 7–25)
CALCIUM: 9.3 mg/dL (ref 8.6–10.4)
CO2: 27 mmol/L (ref 20–31)
Chloride: 100 mmol/L (ref 98–110)
Creat: 0.79 mg/dL (ref 0.50–1.05)
Glucose, Bld: 76 mg/dL (ref 65–99)
POTASSIUM: 4.1 mmol/L (ref 3.5–5.3)
SODIUM: 136 mmol/L (ref 135–146)
TOTAL PROTEIN: 7.2 g/dL (ref 6.1–8.1)
Total Bilirubin: 0.3 mg/dL (ref 0.2–1.2)

## 2016-12-27 LAB — LIPID PANEL
CHOLESTEROL: 205 mg/dL — AB (ref <200)
HDL: 57 mg/dL (ref >50)
LDL CALC: 131 mg/dL — AB (ref <100)
Total CHOL/HDL Ratio: 3.6 Ratio (ref <5.0)
Triglycerides: 84 mg/dL (ref <150)
VLDL: 17 mg/dL (ref <30)

## 2016-12-27 NOTE — Progress Notes (Signed)
51 y.o. G34P3003 Married Caucasian female here for annual exam.    Patient complains of urinary urgency since hysterectomy 11/2014.  Patient occasionally having LLQ pelvic pain without fever or change in bowel or urine habits. One month ago had severe pain in groin area which resolved after a few hours.  Has diminished.  Pain can occur on the left side that is not as painful.  No hot flashes.   Feels like is bloating more and does have IBS.  Had to use Imodium twice a week.   Has urinary frequency.  No dysuria.  No incontinence.  Not a problem for her.  Sleeping ok. One caffeine per day.   PCP: None  Patient's last menstrual period was 03/25/2015.           Sexually active: Yes.   female The current method of family planning is vasectomy/Hysterectomy.    Exercising: Yes.     Smoker:  no  Health Maintenance: Pap: 03-19-14 Neg:Neg HR HPV History of abnormal Pap:  no MMG: 02-22-16 3D/Density B/benign intramammary nodes in Rt.Br./Neg/BiRads2:[facility] Colonoscopy:  NEVER BMD:   n/a  Result  n/a TDaP:  Up to  Date through work--[employer] Gardasil:   no HIV: no Hep C:no Screening Labs:   Urine today: not done   reports that she has never smoked. She has never used smokeless tobacco. She reports that she does not drink alcohol or use drugs.  Past Medical History:  Diagnosis Date  . Anemia   . Anxiety   . Dysmenorrhea   . Dyspareunia   . Fibroid   . Urinary incontinence    occ. with sneezing    Past Surgical History:  Procedure Laterality Date  . ABDOMINAL HYSTERECTOMY N/A 03/29/2015   Procedure: HYSTERECTOMY ABDOMINAL;  Surgeon: Nunzio Cobbs, MD;  Location: [facility];  Service: Gynecology;  Laterality: N/A;  . BILATERAL SALPINGECTOMY Bilateral 03/29/2015   Procedure: BILATERAL SALPINGECTOMY;  Surgeon: Nunzio Cobbs, MD;  Location: [facility];  Service: Gynecology;  Laterality: Bilateral;  . CYSTOSCOPY N/A 03/29/2015   Procedure: CYSTOSCOPY;  Surgeon: Nunzio Cobbs, MD;  Location: [facility];  Service: Gynecology;  Laterality: N/A;    Current Outpatient Prescriptions  Medication Sig Dispense Refill  . acetaminophen (TYLENOL) 500 MG tablet Take 2 tablets (1,000 mg total) by mouth every 6 (six) hours as needed for mild pain. 30 tablet 0  . doxylamine, Sleep, (UNISOM) 25 MG tablet Take 25 mg by mouth at bedtime as needed.    . naphazoline-pheniramine (NAPHCON-A) 0.025-0.3 % ophthalmic solution Place 1 drop into both eyes daily as needed for irritation or allergies.    . pseudoephedrine (SUDAFED) 30 MG tablet Take 30 mg by mouth daily as needed (sinus pressure).      No current facility-administered medications for this visit.     Family History  Problem Relation Age of Onset  . Heart attack Mother   . Breast cancer Mother 34    Estrogen receptive  . Hypertension Mother   . Hyperlipidemia Mother   . Cancer Father 29    Lung ca--dec age 30  . Hypertension Father   . Hyperlipidemia Father   . Cancer Paternal Grandmother 31    colon and pancreatic ca    ROS:  Pertinent items are noted in HPI.  Otherwise, a comprehensive ROS was negative.  Exam:   BP 104/66 (BP Location: Right Arm, Patient Position: Sitting, Cuff Size: Normal)   Pulse 90  Resp 16   Ht 5\' 7"  (1.702 m)   Wt 153 lb (69.4 kg)   LMP 03/25/2015   BMI 23.96 kg/m     General appearance: alert, cooperative and appears stated age Head: Normocephalic, without obvious abnormality, atraumatic Neck: no adenopathy, supple, symmetrical, trachea midline and thyroid normal to inspection and palpation Lungs: clear to auscultation bilaterally Breasts: normal appearance, no masses or tenderness, No nipple retraction or dimpling, No nipple discharge or bleeding, No axillary or supraclavicular adenopathy Heart: regular rate and rhythm Abdomen: soft, non-tender; no masses, no organomegaly Extremities: extremities normal, atraumatic, no cyanosis or edema Skin: Skin color, texture,  turgor normal. No rashes or lesions Lymph nodes: Cervical, supraclavicular, and axillary nodes normal. No abnormal inguinal nodes palpated Neurologic: Grossly normal  Pelvic: External genitalia:  no lesions              Urethra:  normal appearing urethra with no masses, tenderness or lesions              Bartholins and Skenes: normal                 Vagina: normal appearing vagina with normal color and discharge, no lesions              Cervix:  Absent.              Pap taken: No. Bimanual Exam:  Uterus:   Absent.               Adnexa: no mass, fullness, tenderness on right.  Mild tenderness of left adnexal region and no mass palpated.              Rectal exam: Yes.  .  Confirms.              Anus:  normal sphincter tone, no lesions  Chaperone was present for exam.  Assessment:   Well woman visit with normal exam. Status post TAH/bilateral salpingectomy.  FH breast cancer.  Episode of LLQ pain, probable ovarian cyst rupture.   Plan: Mammogram screening discussed. Pap and HR HPV as above. Guidelines for Calcium, Vitamin D, regular exercise program including cardiovascular and weight bearing exercise. Monitor for recurrent pelvic pain.  Will then proceed with pelvic ultrasound.  Routine labs today.  Colonoscopy recommended.  She will check her insurance to see who is in network.  Follow up annually and prn.   After visit summary provided.

## 2016-12-27 NOTE — Patient Instructions (Signed)

## 2016-12-28 LAB — VITAMIN D 25 HYDROXY (VIT D DEFICIENCY, FRACTURES): VIT D 25 HYDROXY: 30 ng/mL (ref 30–100)

## 2016-12-28 LAB — TSH: TSH: 1.23 m[IU]/L

## 2017-01-14 ENCOUNTER — Ambulatory Visit: Payer: BLUE CROSS/BLUE SHIELD | Admitting: Obstetrics and Gynecology

## 2017-06-03 ENCOUNTER — Encounter: Payer: Self-pay | Admitting: Obstetrics and Gynecology

## 2019-03-11 ENCOUNTER — Encounter: Payer: Self-pay | Admitting: Emergency Medicine

## 2019-03-11 ENCOUNTER — Emergency Department
Admission: EM | Admit: 2019-03-11 | Discharge: 2019-03-11 | Disposition: A | Discharge status: Home / Self Care | Payer: BC Managed Care – PPO | Source: Home / Self Care | Attending: Family Medicine | Admitting: Family Medicine

## 2019-03-11 ENCOUNTER — Other Ambulatory Visit: Payer: Self-pay

## 2019-03-11 DIAGNOSIS — G43009 Migraine without aura, not intractable, without status migrainosus: Secondary | ICD-10-CM | POA: Diagnosis not present

## 2019-03-11 MED ORDER — METOCLOPRAMIDE HCL 5 MG/ML IJ SOLN
5.0000 mg | Freq: Once | INTRAMUSCULAR | Status: AC
  Administered 2019-03-11: 5 mg via INTRAMUSCULAR

## 2019-03-11 MED ORDER — DEXAMETHASONE SODIUM PHOSPHATE 10 MG/ML IJ SOLN
10.0000 mg | Freq: Once | INTRAMUSCULAR | Status: AC
  Administered 2019-03-11: 10 mg via INTRAMUSCULAR

## 2019-03-11 MED ORDER — KETOROLAC TROMETHAMINE 60 MG/2ML IM SOLN
60.0000 mg | Freq: Once | INTRAMUSCULAR | Status: AC
  Administered 2019-03-11: 60 mg via INTRAMUSCULAR

## 2019-03-11 NOTE — ED Provider Notes (Signed)
[facility]    CSN: [id number] Arrival date & time: 03/11/19  1157     History   Chief Complaint Chief Complaint  Patient presents with  . Migraine    HPI Lynn Schmidt is a 53 y.o. female.   Patient awoke this morning with a left side migraine headache, including nausea (without vomiting), and light sensitivity.  Her headache is typical, although she rarely has them.  The history is provided by the patient.  Migraine This is a recurrent problem. Episode onset: 6 hours ago. The problem occurs constantly. The problem has not changed since onset.Associated symptoms include headaches. Associated symptoms comments: Nausea without vomiting.. The symptoms are aggravated by exertion. Nothing relieves the symptoms. She has tried acetaminophen for the symptoms. The treatment provided mild relief.    Past Medical History:  Diagnosis Date  . Anemia   . Anxiety   . Dysmenorrhea   . Dyspareunia   . Fibroid   . Urinary incontinence    occ. with sneezing    Patient Active Problem List   Diagnosis Date Noted  . Status post total abdominal hysterectomy 03/29/2015  . MYALGIA 02/16/2011    Past Surgical History:  Procedure Laterality Date  . ABDOMINAL HYSTERECTOMY N/A 03/29/2015   Procedure: HYSTERECTOMY ABDOMINAL;  Surgeon: Nunzio Cobbs, MD;  Location: [facility];  Service: Gynecology;  Laterality: N/A;  . BILATERAL SALPINGECTOMY Bilateral 03/29/2015   Procedure: BILATERAL SALPINGECTOMY;  Surgeon: Nunzio Cobbs, MD;  Location: [facility];  Service: Gynecology;  Laterality: Bilateral;  . CYSTOSCOPY N/A 03/29/2015   Procedure: CYSTOSCOPY;  Surgeon: Nunzio Cobbs, MD;  Location: [facility];  Service: Gynecology;  Laterality: N/A;    OB History    Gravida  3   Para  3   Term  3   Preterm      AB      Living  3     SAB      TAB      Ectopic      Multiple      Live Births               Home Medications    Prior to Admission  medications   Medication Sig Start Date End Date Taking? Authorizing Provider  acetaminophen (TYLENOL) 500 MG tablet Take 2 tablets (1,000 mg total) by mouth every 6 (six) hours as needed for mild pain. 03/31/15   Nunzio Cobbs, MD  doxylamine, Sleep, (UNISOM) 25 MG tablet Take 25 mg by mouth at bedtime as needed.    [provider]  naphazoline-pheniramine (NAPHCON-A) 0.025-0.3 % ophthalmic solution Place 1 drop into both eyes daily as needed for irritation or allergies.    [provider]  pseudoephedrine (SUDAFED) 30 MG tablet Take 30 mg by mouth daily as needed (sinus pressure).     [provider]    Family History Family History  Problem Relation Age of Onset  . Heart attack Mother   . Breast cancer Mother 73       Estrogen receptive  . Hypertension Mother   . Hyperlipidemia Mother   . Cancer Father 36       Lung ca--dec age 21  . Hypertension Father   . Hyperlipidemia Father   . Cancer Paternal Grandmother 31       colon and pancreatic ca    Social History Social History   Tobacco Use  . Smoking status: Never  Smoker  . Smokeless tobacco: Never Used  Substance Use Topics  . Alcohol use: No    Alcohol/week: 0.0 standard drinks  . Drug use: No     Allergies   Asa [aspirin], Ibuprofen, and Penicillins   Review of Systems Review of Systems  Constitutional: Positive for activity change and appetite change. Negative for chills, diaphoresis, fatigue and fever.  HENT: Negative.   Eyes: Positive for photophobia.  Respiratory: Negative.   Cardiovascular: Negative.   Gastrointestinal: Positive for nausea. Negative for diarrhea.  Genitourinary: Negative.   Musculoskeletal: Negative.   Skin: Negative.   Neurological: Positive for headaches. Negative for dizziness, tremors, seizures, syncope, facial asymmetry, speech difficulty, weakness, light-headedness and numbness.     Physical Exam Triage Vital Signs ED Triage Vitals  Enc  Vitals Group     BP      Pulse      Resp      Temp      Temp src      SpO2      Weight      Height      Head Circumference      Peak Flow      Pain Score      Pain Loc      Pain Edu?      Excl. in [place]?    No data found.  Updated Vital Signs LMP 03/25/2015   Visual Acuity Right Eye Distance:   Left Eye Distance:   Bilateral Distance:    Right Eye Near:   Left Eye Near:    Bilateral Near:     Physical Exam Nursing notes and Vital Signs reviewed. Appearance:  Patient appears stated age, and in no acute distress Eyes:  Pupils are equal, round, and reactive to light and accomodation.  Extraocular movement is intact.  Conjunctivae are not inflamed.  Mild photophobia present.  Fundi benign. Ears:  Canals normal.  Tympanic membranes normal.  Nose:   Normal turbinates.  No sinus tenderness.  Pharynx:  Normal Neck:  Supple.  No adenopathy.   Lungs:  Clear to auscultation.  Breath sounds are equal.  Moving air well. Heart:  Regular rate and rhythm without murmurs, rubs, or gallops.  Abdomen:  Nontender without masses or hepatosplenomegaly.  Bowel sounds are present.  No CVA or flank tenderness.  Extremities:  No edema.  Skin:  No rash present.   Neurologic:  Cranial nerves 2 through 12 are normal.  Patellar, achilles, and elbow reflexes are normal.  Cerebellar function is intact (finger-to-nose and rapid alternating hand movement).  Gait and station are normal.    UC Treatments / Results  Labs (all labs ordered are listed, but only abnormal results are displayed) Labs Reviewed - No data to display  EKG   Radiology No results found.  Procedures Procedures (including critical care time)  Medications Ordered in UC Medications  ketorolac (TORADOL) injection 60 mg (has no administration in time range)  metoCLOPramide (REGLAN) injection 5 mg (has no administration in time range)  dexamethasone (DECADRON) injection 10 mg (has no administration in time range)    Initial  Impression / Assessment and Plan / UC Course  I have reviewed the triage vital signs and the nursing notes.  Pertinent labs & imaging results that were available during my care of the patient were reviewed by me and considered in my medical decision making (see chart for details).    Administered Toradol 60mg  IM, Reglan 5mg  IM, and Decadron 10mg  IM Followup with  PCP if not improving. Final Clinical Impressions(s) / UC Diagnoses   Final diagnoses:  Migraine without aura and without status migrainosus, not intractable     Discharge Instructions     If symptoms become significantly worse during the night or over the weekend, proceed to the local emergency room.     ED Prescriptions    None         Kandra Nicolas, MD 03/11/19 1416

## 2019-03-11 NOTE — Discharge Instructions (Addendum)
If symptoms become significantly worse during the night or over the weekend, proceed to the local emergency room.  

## 2019-03-11 NOTE — ED Triage Notes (Signed)
Migraine with aura, nausea, light sensitivity since she woke up this morning

## 2019-08-10 ENCOUNTER — Encounter: Payer: Self-pay | Admitting: Obstetrics and Gynecology

## 2019-12-21 ENCOUNTER — Other Ambulatory Visit: Payer: Self-pay

## 2019-12-21 ENCOUNTER — Encounter: Payer: Self-pay | Admitting: Obstetrics and Gynecology

## 2019-12-21 ENCOUNTER — Ambulatory Visit: Payer: 59 | Admitting: Obstetrics and Gynecology

## 2019-12-21 ENCOUNTER — Telehealth: Payer: Self-pay | Admitting: Obstetrics and Gynecology

## 2019-12-21 VITALS — BP 136/70 | HR 86 | Temp 97.3°F | Resp 14 | Ht 68.0 in | Wt 169.2 lb

## 2019-12-21 DIAGNOSIS — Z90710 Acquired absence of both cervix and uterus: Secondary | ICD-10-CM | POA: Diagnosis not present

## 2019-12-21 DIAGNOSIS — N9489 Other specified conditions associated with female genital organs and menstrual cycle: Secondary | ICD-10-CM | POA: Diagnosis not present

## 2019-12-21 DIAGNOSIS — R102 Pelvic and perineal pain: Secondary | ICD-10-CM

## 2019-12-21 LAB — POCT URINALYSIS DIPSTICK
Bilirubin, UA: NEGATIVE
Blood, UA: NEGATIVE
Glucose, UA: NEGATIVE
Ketones, UA: NEGATIVE
Leukocytes, UA: NEGATIVE
Nitrite, UA: NEGATIVE
Protein, UA: NEGATIVE
Urobilinogen, UA: 0.2 E.U./dL
pH, UA: 6 (ref 5.0–8.0)

## 2019-12-21 NOTE — Progress Notes (Signed)
GYNECOLOGY  VISIT   HPI: 54 y.o.   Married White or Caucasian Not Hispanic or Latino  female   [phone] with Patient's last menstrual period was 03/25/2015.   here for   Pelvic pain that started last week.  The pain started in her LLQ, intermittent stabbing pain initially. Since then it has included her LLQ and mid lower abdomen. Now the pain is a dull constant ache, intermittently sharp. Can be worse with movement. Some pelvic pressure when sitting to void or have a BM.  She has IBS, BM every 1-3 days. She had a colonoscopy in 12/20, some mild inflammation from the prep. Normal bladder function. No fever, no nausea, no emesis. She has a h/o TAH/BS. She has only had a few vasomotor symptoms.   GYNECOLOGIC HISTORY: Patient's last menstrual period was 03/25/2015. Contraception: hysterectomy/vasectomy  Menopausal hormone therapy: none         OB History    Gravida  3   Para  3   Term  3   Preterm      AB      Living  3     SAB      TAB      Ectopic      Multiple      Live Births                 Patient Active Problem List   Diagnosis Date Noted  . Status post total abdominal hysterectomy 03/29/2015  . MYALGIA 02/16/2011    Past Medical History:  Diagnosis Date  . Anemia   . Anxiety   . Dysmenorrhea   . Dyspareunia   . Fibroid   . Urinary incontinence    occ. with sneezing    Past Surgical History:  Procedure Laterality Date  . ABDOMINAL HYSTERECTOMY N/A 03/29/2015   Procedure: HYSTERECTOMY ABDOMINAL;  Surgeon: Nunzio Cobbs, MD;  Location: [facility];  Service: Gynecology;  Laterality: N/A;  . BILATERAL SALPINGECTOMY Bilateral 03/29/2015   Procedure: BILATERAL SALPINGECTOMY;  Surgeon: Nunzio Cobbs, MD;  Location: [facility];  Service: Gynecology;  Laterality: Bilateral;  . CYSTOSCOPY N/A 03/29/2015   Procedure: CYSTOSCOPY;  Surgeon: Nunzio Cobbs, MD;  Location: [facility];  Service: Gynecology;  Laterality: N/A;    Current  Outpatient Medications  Medication Sig Dispense Refill  . acetaminophen (TYLENOL) 500 MG tablet Take 2 tablets (1,000 mg total) by mouth every 6 (six) hours as needed for mild pain. 30 tablet 0  . doxylamine, Sleep, (UNISOM) 25 MG tablet Take 25 mg by mouth at bedtime as needed.    . naphazoline-pheniramine (NAPHCON-A) 0.025-0.3 % ophthalmic solution Place 1 drop into both eyes daily as needed for irritation or allergies.    . pseudoephedrine (SUDAFED) 30 MG tablet Take 30 mg by mouth daily as needed (sinus pressure).      No current facility-administered medications for this visit.     ALLERGIES: Asa [aspirin], Ibuprofen, and Penicillins  Family History  Problem Relation Age of Onset  . Heart attack Mother   . Breast cancer Mother 64       Estrogen receptive  . Hypertension Mother   . Hyperlipidemia Mother   . Cancer Father 14       Lung ca--dec age 25  . Hypertension Father   . Hyperlipidemia Father   . Cancer Paternal Grandmother 51       colon and pancreatic ca    Social History  Socioeconomic History  . Marital status: Married    Spouse name: Not on file  . Number of children: Not on file  . Years of education: Not on file  . Highest education level: Not on file  Occupational History  . Not on file  Tobacco Use  . Smoking status: Never Smoker  . Smokeless tobacco: Never Used  Substance and Sexual Activity  . Alcohol use: Yes    Alcohol/week: 0.0 standard drinks  . Drug use: No  . Sexual activity: Yes    Partners: Male    Birth control/protection: Other-see comments, Surgical    Comment: vasectomy/TAH/Bil.salpingectomy  Other Topics Concern  . Not on file  Social History Narrative  . Not on file   Social Determinants of Health   Financial Resource Strain:   . Difficulty of Paying Living Expenses:   Food Insecurity:   . Worried About Charity fundraiser in the Last Year:   . Arboriculturist in the Last Year:   Transportation Needs:   . Lexicographer (Medical):   Marland Kitchen Lack of Transportation (Non-Medical):   Physical Activity:   . Days of Exercise per Week:   . Minutes of Exercise per Session:   Stress:   . Feeling of Stress :   Social Connections:   . Frequency of Communication with Friends and Family:   . Frequency of Social Gatherings with Friends and Family:   . Attends Religious Services:   . Active Member of Clubs or Organizations:   . Attends Archivist Meetings:   Marland Kitchen Marital Status:   Intimate Partner Violence:   . Fear of Current or Ex-Partner:   . Emotionally Abused:   Marland Kitchen Physically Abused:   . Sexually Abused:     Review of Systems  Constitutional: Negative.   HENT: Negative.   Eyes: Negative.   Respiratory: Negative.   Cardiovascular: Negative.   Gastrointestinal: Negative.   Genitourinary:       Pelvic pain Pressure   Musculoskeletal: Negative.   Skin: Negative.   Neurological: Negative.   Endo/Heme/Allergies: Negative.   Psychiatric/Behavioral: Negative.     PHYSICAL EXAMINATION:    BP 136/70 (BP Location: Right Arm, Patient Position: Sitting, Cuff Size: Normal)   Pulse 86   Temp (!) 97.3 F (36.3 C) (Skin)   Resp 14   Ht 5\' 8"  (1.727 m)   Wt 169 lb 4 oz (76.8 kg)   LMP 03/25/2015   BMI 25.73 kg/m     General appearance: alert, cooperative and appears stated age Abdomen: soft, tender in the LLQ and mid lower abdomen, no rebound, no guarding.  Non distended, no masses,  no organomegaly  Pelvic: External genitalia:  no lesions              Urethra:  normal appearing urethra with no masses, tenderness or lesions              Bartholins and Skenes: normal                 Vagina: mildly atrophic appearing vagina with normal color and discharge, no lesions              Cervix: absent              Bimanual Exam:  Uterus:  uterus absent              Adnexa: cystic feeling mass mid to left side, tender 6 cm.  Rectovaginal: Yes.  .  Confirms.              Anus:   normal sphincter tone, no lesions  Chaperone was present for exam.  ASSESSMENT Pelvic pain Tender pelvic mass, suspect ovarian cyst    PLAN Return for pelvic ultrasound Discussed risk of torsion and rupture and need to be seen if pain acutely worsens Tylenol for pain, can't take ibuprofen

## 2019-12-21 NOTE — Patient Instructions (Signed)
Ovarian Cyst     An ovarian cyst is a fluid-filled sac that forms on an ovary. The ovaries are small organs that produce eggs in women. Various types of cysts can form on the ovaries. Some may cause symptoms and require treatment. Most ovarian cysts go away on their own, are not cancerous (are benign), and do not cause problems. Common types of ovarian cysts include:  Functional (follicle) cysts. ? Occur during the menstrual cycle, and usually go away with the next menstrual cycle if you do not get pregnant. ? Usually cause no symptoms.  Endometriomas. ? Are cysts that form from the tissue that lines the uterus (endometrium). ? Are sometimes called "chocolate cysts" because they become filled with blood that turns brown. ? Can cause pain in the lower abdomen during intercourse and during your period.  Cystadenoma cysts. ? Develop from cells on the outside surface of the ovary. ? Can get very large and cause lower abdomen pain and pain with intercourse. ? Can cause severe pain if they twist or break open (rupture).  Dermoid cysts. ? Are sometimes found in both ovaries. ? May contain different kinds of body tissue, such as skin, teeth, hair, or cartilage. ? Usually do not cause symptoms unless they get very big.  Theca lutein cysts. ? Occur when too much of a certain hormone (human chorionic gonadotropin) is produced and overstimulates the ovaries to produce an egg. ? Are most common after having procedures used to assist with the conception of a baby (in vitro fertilization). What are the causes? Ovarian cysts may be caused by:  Ovarian hyperstimulation syndrome. This is a condition that can develop from taking fertility medicines. It causes multiple large ovarian cysts to form.  Polycystic ovarian syndrome (PCOS). This is a common hormonal disorder that can cause ovarian cysts, as well as problems with your period or fertility. What increases the risk? The following factors may  make you more likely to develop ovarian cysts:  Being overweight or obese.  Taking fertility medicines.  Taking certain forms of hormonal birth control.  Smoking. What are the signs or symptoms? Many ovarian cysts do not cause symptoms. If symptoms are present, they may include:  Pelvic pain or pressure.  Pain in the lower abdomen.  Pain during sex.  Abdominal swelling.  Abnormal menstrual periods.  Increasing pain with menstrual periods. How is this diagnosed? These cysts are commonly found during a routine pelvic exam. You may have tests to find out more about the cyst, such as:  Ultrasound.  X-ray of the pelvis.  CT scan.  MRI.  Blood tests. How is this treated? Many ovarian cysts go away on their own without treatment. Your health care provider may want to check your cyst regularly for 2-3 months to see if it changes. If you are in menopause, it is especially important to have your cyst monitored closely because menopausal women have a higher rate of ovarian cancer. When treatment is needed, it may include:  Medicines to help relieve pain.  A procedure to drain the cyst (aspiration).  Surgery to remove the whole cyst.  Hormone treatment or birth control pills. These methods are sometimes used to help dissolve a cyst. Follow these instructions at home:  Take over-the-counter and prescription medicines only as told by your health care provider.  Do not drive or use heavy machinery while taking prescription pain medicine.  Get regular pelvic exams and Pap tests as often as told by your health care provider.    Return to your normal activities as told by your health care provider. Ask your health care provider what activities are safe for you.  Do not use any products that contain nicotine or tobacco, such as cigarettes and e-cigarettes. If you need help quitting, ask your health care provider.  Keep all follow-up visits as told by your health care provider.  This is important. Contact a health care provider if:  Your periods are late, irregular, or painful, or they stop.  You have pelvic pain that does not go away.  You have pressure on your bladder or trouble emptying your bladder completely.  You have pain during sex.  You have any of the following in your abdomen: ? A feeling of fullness. ? Pressure. ? Discomfort. ? Pain that does not go away. ? Swelling.  You feel generally ill.  You become constipated.  You lose your appetite.  You develop severe acne.  You start to have more body hair and facial hair.  You are gaining weight or losing weight without changing your exercise and eating habits.  You think you may be pregnant. Get help right away if:  You have abdominal pain that is severe or gets worse.  You cannot eat or drink without vomiting.  You suddenly develop a fever.  Your menstrual period is much heavier than usual. This information is not intended to replace advice given to you by your health care provider. Make sure you discuss any questions you have with your health care provider. Document Revised: 11/18/2017 Document Reviewed: 01/22/2016 Elsevier Patient Education  2020 Elsevier Inc.  

## 2019-12-21 NOTE — Telephone Encounter (Signed)
Patient request an appointment with Dr.Silva. She stated she has been having pelvic pain that was quite severe over the weekend.

## 2019-12-21 NOTE — Telephone Encounter (Signed)
Last AEX 12/2016 with BS, Hysterectomy in 03/2015. No HRT.   Spoke with pt. Pt reports having pelvic pain that started last Thursday and Friday that was more on left side, now all over lower abd/pelvic area. Pt states lower abd tender on palpation and has pressure, not pain. Denies vaginal bleeding, discharge, fever, chills or any urinary sx or change in bowel habits. Pt states sometimes has urinary pressure intermittently.  Pt advised to be seen for further evaluation. Pt informed Dr Quincy Simmonds not in office and encouraged to be seen by another provider. Pt agreeable. Pt scheduled as work-in today 4/19 at 1:45 pm with Dr Talbert Nan. Pt verbalized understanding of date and time. CPS neg.   Routing to Dr Talbert Nan for review.  Encounter closed.

## 2019-12-22 ENCOUNTER — Ambulatory Visit (INDEPENDENT_AMBULATORY_CARE_PROVIDER_SITE_OTHER): Payer: 59

## 2019-12-22 ENCOUNTER — Encounter: Payer: Self-pay | Admitting: Obstetrics and Gynecology

## 2019-12-22 ENCOUNTER — Ambulatory Visit: Payer: 59 | Admitting: Obstetrics and Gynecology

## 2019-12-22 VITALS — BP 118/60 | HR 80 | Temp 98.1°F | Ht 68.0 in | Wt 169.0 lb

## 2019-12-22 DIAGNOSIS — N9489 Other specified conditions associated with female genital organs and menstrual cycle: Secondary | ICD-10-CM

## 2019-12-22 DIAGNOSIS — R102 Pelvic and perineal pain: Secondary | ICD-10-CM | POA: Diagnosis not present

## 2019-12-22 DIAGNOSIS — R109 Unspecified abdominal pain: Secondary | ICD-10-CM

## 2019-12-22 NOTE — Progress Notes (Signed)
GYNECOLOGY  VISIT   HPI: 54 y.o.   Married White or Caucasian Not Hispanic or Latino  female   [phone] with Patient's last menstrual period was 03/25/2015.   here for ultrasound consult.  The patient was seen yesterday with abdominal pelvic pain and was noted to have a tender adnexal mass. She continues to have low level pain. BM q 1-3 days. No fever, no nausea.  H/O hysterectomy.    GYNECOLOGIC HISTORY: Patient's last menstrual period was 03/25/2015. Contraception:PMP Menopausal hormone therapy: none         OB History    Gravida  3   Para  3   Term  3   Preterm      AB      Living  3     SAB      TAB      Ectopic      Multiple      Live Births                 Patient Active Problem List   Diagnosis Date Noted  . Status post total abdominal hysterectomy 03/29/2015  . MYALGIA 02/16/2011    Past Medical History:  Diagnosis Date  . Anemia   . Anxiety   . Dysmenorrhea   . Dyspareunia   . Fibroid   . Urinary incontinence    occ. with sneezing    Past Surgical History:  Procedure Laterality Date  . ABDOMINAL HYSTERECTOMY N/A 03/29/2015   Procedure: HYSTERECTOMY ABDOMINAL;  Surgeon: Nunzio Cobbs, MD;  Location: [facility];  Service: Gynecology;  Laterality: N/A;  . BILATERAL SALPINGECTOMY Bilateral 03/29/2015   Procedure: BILATERAL SALPINGECTOMY;  Surgeon: Nunzio Cobbs, MD;  Location: [facility];  Service: Gynecology;  Laterality: Bilateral;  . CYSTOSCOPY N/A 03/29/2015   Procedure: CYSTOSCOPY;  Surgeon: Nunzio Cobbs, MD;  Location: [facility];  Service: Gynecology;  Laterality: N/A;    Current Outpatient Medications  Medication Sig Dispense Refill  . acetaminophen (TYLENOL) 500 MG tablet Take 2 tablets (1,000 mg total) by mouth every 6 (six) hours as needed for mild pain. 30 tablet 0  . doxylamine, Sleep, (UNISOM) 25 MG tablet Take 25 mg by mouth at bedtime as needed.    . naphazoline-pheniramine (NAPHCON-A)  0.025-0.3 % ophthalmic solution Place 1 drop into both eyes daily as needed for irritation or allergies.    . pseudoephedrine (SUDAFED) 30 MG tablet Take 30 mg by mouth daily as needed (sinus pressure).      No current facility-administered medications for this visit.     ALLERGIES: Asa [aspirin], Ibuprofen, and Penicillins  Family History  Problem Relation Age of Onset  . Heart attack Mother   . Breast cancer Mother 58       Estrogen receptive  . Hypertension Mother   . Hyperlipidemia Mother   . Cancer Father 41       Lung ca--dec age 34  . Hypertension Father   . Hyperlipidemia Father   . Cancer Paternal Grandmother 33       colon and pancreatic ca    Social History   Socioeconomic History  . Marital status: Married    Spouse name: Not on file  . Number of children: Not on file  . Years of education: Not on file  . Highest education level: Not on file  Occupational History  . Not on file  Tobacco Use  . Smoking status: Never Smoker  . Smokeless tobacco:  Never Used  Substance and Sexual Activity  . Alcohol use: Yes    Alcohol/week: 0.0 standard drinks  . Drug use: No  . Sexual activity: Yes    Partners: Male    Birth control/protection: Other-see comments, Surgical    Comment: vasectomy/TAH/Bil.salpingectomy  Other Topics Concern  . Not on file  Social History Narrative  . Not on file   Social Determinants of Health   Financial Resource Strain:   . Difficulty of Paying Living Expenses:   Food Insecurity:   . Worried About Charity fundraiser in the Last Year:   . Arboriculturist in the Last Year:   Transportation Needs:   . Film/video editor (Medical):   Marland Kitchen Lack of Transportation (Non-Medical):   Physical Activity:   . Days of Exercise per Week:   . Minutes of Exercise per Session:   Stress:   . Feeling of Stress :   Social Connections:   . Frequency of Communication with Friends and Family:   . Frequency of Social Gatherings with Friends and  Family:   . Attends Religious Services:   . Active Member of Clubs or Organizations:   . Attends Archivist Meetings:   Marland Kitchen Marital Status:   Intimate Partner Violence:   . Fear of Current or Ex-Partner:   . Emotionally Abused:   Marland Kitchen Physically Abused:   . Sexually Abused:     Review of Systems  All other systems reviewed and are negative.   PHYSICAL EXAMINATION:    BP 118/60   Pulse 80   Temp 98.1 F (36.7 C)   Ht 5\' 8"  (1.727 m)   Wt 169 lb (76.7 kg)   LMP 03/25/2015   SpO2 97%   BMI 25.70 kg/m     General appearance: alert, cooperative and appears stated age  Ultrasound images were reviewed with the patient. Normal ovaries bilaterally, absent uterus. Bowel wall appears thickened (was very tender during the exam).  ASSESSMENT Abdominal pelvic pain, tender adnexal mass noted yesterday. Ultrasound with normal ovaries, bowel wall is thickened and tender with palpation with the ultrasound.     PLAN Scheduled for an appointment with GI for tomorrow CBC with diff Go to the ER with worsening pain or fever.

## 2019-12-22 NOTE — Progress Notes (Signed)
Spoke with Lattie Haw at Dr. Lorie Apley office. Patient scheduled for OV with Dr. Benson Norway on 12/23/19 at 11:15am. Patient is agreeable to date and time.

## 2019-12-23 ENCOUNTER — Ambulatory Visit
Admission: RE | Admit: 2019-12-23 | Discharge: 2019-12-23 | Disposition: A | Discharge status: Home / Self Care | Payer: 59 | Source: Ambulatory Visit | Attending: Gastroenterology | Admitting: Gastroenterology

## 2019-12-23 ENCOUNTER — Other Ambulatory Visit: Payer: Self-pay | Admitting: Gastroenterology

## 2019-12-23 ENCOUNTER — Other Ambulatory Visit: Payer: Self-pay

## 2019-12-23 DIAGNOSIS — R1032 Left lower quadrant pain: Secondary | ICD-10-CM

## 2019-12-23 LAB — CBC WITH DIFFERENTIAL/PLATELET
Basophils Absolute: 0.1 10*3/uL (ref 0.0–0.2)
Basos: 1 %
EOS (ABSOLUTE): 0.1 10*3/uL (ref 0.0–0.4)
Eos: 1 %
Hematocrit: 37.4 % (ref 34.0–46.6)
Hemoglobin: 12.4 g/dL (ref 11.1–15.9)
Immature Grans (Abs): 0 10*3/uL (ref 0.0–0.1)
Immature Granulocytes: 0 %
Lymphocytes Absolute: 2.6 10*3/uL (ref 0.7–3.1)
Lymphs: 33 %
MCH: 28.8 pg (ref 26.6–33.0)
MCHC: 33.2 g/dL (ref 31.5–35.7)
MCV: 87 fL (ref 79–97)
Monocytes Absolute: 0.7 10*3/uL (ref 0.1–0.9)
Monocytes: 8 %
Neutrophils Absolute: 4.5 10*3/uL (ref 1.4–7.0)
Neutrophils: 57 %
Platelets: 367 10*3/uL (ref 150–450)
RBC: 4.3 x10E6/uL (ref 3.77–5.28)
RDW: 12.9 % (ref 11.7–15.4)
WBC: 8 10*3/uL (ref 3.4–10.8)

## 2019-12-23 MED ORDER — IOPAMIDOL (ISOVUE-300) INJECTION 61%
100.0000 mL | Freq: Once | INTRAVENOUS | Status: AC | PRN
  Administered 2019-12-23: 100 mL via INTRAVENOUS

## 2020-02-15 NOTE — Patient Instructions (Addendum)
It was very nice to see you today! Best of luck with your upcoming trip I will be in touch with your labs asap For insomnia, please try taking 25- 50 mg of trazodone at bedtime as needed.  If 50 mg is not effective ok to try 100 mg after 5-7 days  Use the alprazolam sparingly as needed for anxiety- do not drive after taking this medication  Take care JC

## 2020-02-15 NOTE — Progress Notes (Addendum)
[facility] [address] [place],  [address] [phone]  Date:  02/17/2020   Name:  Lynn Schmidt   DOB:  03/29/1966   MRN:  [id number]  PCP:  Lynn Mclean, MD    Chief Complaint: New Patient (Initial Visit) and Insomnia (3 hours average of sleep, stress)   History of Present Illness:  Lynn Schmidt is a 54 y.o. very pleasant female patient who presents with the following:  Patient here today to establish care Her GYN is Dr. Barbette Hair She was seen a month ago by GYN for belly pain- she ended up being dx with diverticulitis  CT scan 12/23/19: IMPRESSION: 1. Mild local diverticulitis of the proximal sigmoid colon adjacent to the vaginal cuff. No extraluminal gas or abscess. 2. Borderline wall thickening of the gallbladder. 3. Mild mosaic attenuation in the lung bases probably from air trapping, less likely extrinsic allergic alveolitis. 4. Aortic atherosclerosis.  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  She is from [place], has lived in [place] since she finished college Married to [place], 3 children.  Her youngest just graduated from college.  Her oldest daughter is an Forensic psychologist in [place], her 2 sons are in [place] and they are both applying to [employer] this year!  She enjoys reading, hiking, kayaking She notes an issue with insomnia- this has been a chronic issue She is using melatonin and unisom- has used for years The unisom leaves her drowsy the next day.  Melatonin does not but it is not as helpful either  Since menopause this has been worse   She cannot get to sleep- once she is asleep she does pretty well She uses an OTC every day for sleep "otherwise I won't sleep at all" She typically goes to bed about 9:30- will fall asleep perhaps 10:30.  She will rise at 5 am.  She  often still feels tired in the am She does not snore No sleep apnea is apparent per her husband.  She does not generally nap  She has gained a bit of weight due to being tired and not exercising   She is also under a fair amount of stress right now due to some family issues. She will be flying to [place] soon to address these and also has some fear of flying   Patient Active Problem List   Diagnosis Date Noted  . Status post total abdominal hysterectomy 03/29/2015  . MYALGIA 02/16/2011    Past Medical History:  Diagnosis Date  . Anemia   . Anxiety   . Arthritis   . Diverticulitis   . Dysmenorrhea   . Dyspareunia   . Fibroid   . Urinary incontinence    occ. with sneezing    Past Surgical History:  Procedure Laterality Date  . ABDOMINAL HYSTERECTOMY N/A 03/29/2015   Procedure: HYSTERECTOMY ABDOMINAL;  Surgeon: Nunzio Cobbs, MD;  Location: [facility];  Service: Gynecology;  Laterality: N/A;  . BILATERAL SALPINGECTOMY Bilateral 03/29/2015   Procedure: BILATERAL SALPINGECTOMY;  Surgeon: Nunzio Cobbs, MD;  Location: [facility];  Service: Gynecology;  Laterality: Bilateral;  . CYSTOSCOPY N/A 03/29/2015   Procedure: CYSTOSCOPY;  Surgeon: Nunzio Cobbs, MD;  Location: [facility];  Service: Gynecology;  Laterality: N/A;    Social  History   Tobacco Use  . Smoking status: Never Smoker  . Smokeless tobacco: Never Used  Vaping Use  . Vaping Use: Never used  Substance Use Topics  . Alcohol use: Yes    Alcohol/week: 0.0 standard drinks    Comment: rarely  . Drug use: No    Family History  Problem Relation Age of Onset  . Heart attack Mother   . Breast cancer Mother 26       Estrogen receptive  . Hypertension Mother   . Hyperlipidemia Mother   . Cancer Father 76       Lung ca--dec age 43  . Hypertension Father   . Hyperlipidemia Father   . Cancer Paternal Grandmother 18       colon and pancreatic ca    Allergies  Allergen Reactions  . Asa [Aspirin]  Other (See Comments)    Gastritis.  . Ciprofloxacin   . Flagyl [Metronidazole] Hives  . Ibuprofen Other (See Comments)    Gastritis.  Marland Kitchen Penicillins Hives    Hasn't taken since she was a child    Medication list has been reviewed and updated.  Current Outpatient Medications on File Prior to Visit  Medication Sig Dispense Refill  . acetaminophen (TYLENOL) 500 MG tablet Take 2 tablets (1,000 mg total) by mouth every 6 (six) hours as needed for mild pain. 30 tablet 0  . diclofenac Sodium (VOLTAREN) 1 % GEL Apply topically 4 (four) times daily.    Marland Kitchen doxylamine, Sleep, (UNISOM) 25 MG tablet Take 25 mg by mouth at bedtime as needed.    Marland Kitchen esomeprazole (NEXIUM) 40 MG capsule Take 40 mg by mouth daily at 12 noon.    . Multiple Vitamin (MULTIVITAMIN ADULT PO) Take by mouth.    . naphazoline-pheniramine (NAPHCON-A) 0.025-0.3 % ophthalmic solution Place 1 drop into both eyes daily as needed for irritation or allergies.    . pseudoephedrine (SUDAFED) 30 MG tablet Take 30 mg by mouth daily as needed (sinus pressure).     Marland Kitchen UNABLE TO FIND daily. Med Name: Ashwagandha Supplement     No current facility-administered medications on file prior to visit.    Review of Systems:  As per HPI- otherwise negative.   Physical Examination: Vitals:   02/17/20 1302  BP: 108/78  Pulse: 66  Resp: 16  Temp: 97.7 F (36.5 C)  SpO2: 98%   Vitals:   02/17/20 1302  Weight: 168 lb (76.2 kg)  Height: 5\' 8"  (1.727 m)   Body mass index is 25.54 kg/m. Ideal Body Weight: Weight in (lb) to have BMI = 25: 164.1  GEN: no acute distress.  Minimal overweight, looks well  HEENT: Atraumatic, Normocephalic.  Ears and Nose: No external deformity. CV: RRR, No M/G/R. No JVD. No thrill. No extra heart sounds. PULM: CTA B, no wheezes, crackles, rhonchi. No retractions. No resp. distress. No accessory muscle use. ABD: S, NT, ND, +BS. No rebound. No HSM. EXTR: No c/c/e PSYCH: Normally interactive. Conversant.     Assessment and Plan: Screening for hyperlipidemia - Plan: Lipid panel  Screening for thyroid disorder - Plan: TSH  Screening for diabetes mellitus - Plan: Comprehensive metabolic panel, Hemoglobin A1c  Primary insomnia - Plan: traZODone (DESYREL) 50 MG tablet  Stressful life event affecting family - Plan: ALPRAZolam (XANAX) 0.25 MG tablet  Establishing care today Labs pending as above Long-term history of insomnia, she has been treating with over-the-counter medications without a lot of success.  We will try trazodone 25 to 50  mg, may increase to 100 after 5 to 7 days if necessary.  I also did give her a small supply of alprazolam to use as she is currently under quite a bit of stress and also has to fly in an airplane.  I have asked her to keep me updated as to how this medication works for sleep, if trazodone is not helpful we will consider a different option Moderate medical decision making today This visit occurred during the SARS-CoV-2 public health emergency.  Safety protocols were in place, including screening questions prior to the visit, additional usage of staff PPE, and extensive cleaning of exam room while observing appropriate contact time as indicated for disinfecting solutions.    Signed Lamar Blinks, MD  Addendum 6/17, received her labs as follows  Message to patient  Results for orders placed or performed in visit on 02/17/20  Comprehensive metabolic panel  Result Value Ref Range   Sodium 140 135 - 145 mEq/L   Potassium 3.9 3.5 - 5.1 mEq/L   Chloride 102 96 - 112 mEq/L   CO2 33 (H) 19 - 32 mEq/L   Glucose, Bld 102 (H) 70 - 99 mg/dL   BUN 13 6 - 23 mg/dL   Creatinine, Ser 0.79 0.40 - 1.20 mg/dL   Total Bilirubin 0.5 0.2 - 1.2 mg/dL   Alkaline Phosphatase 55 39 - 117 U/L   AST 30 0 - 37 U/L   ALT 51 (H) 0 - 35 U/L   Total Protein 7.2 6.0 - 8.3 g/dL   Albumin 4.5 3.5 - 5.2 g/dL   GFR 75.83 >60.00 mL/min   Calcium 10.0 8.4 - 10.5 mg/dL  Hemoglobin A1c   Result Value Ref Range   Hgb A1c MFr Bld 6.2 4.6 - 6.5 %  Lipid panel  Result Value Ref Range   Cholesterol 232 (H) 0 - 200 mg/dL   Triglycerides 97.0 0 - 149 mg/dL   HDL 54.30 >39.00 mg/dL   VLDL 19.4 0.0 - 40.0 mg/dL   LDL Cholesterol 158 (H) 0 - 99 mg/dL   Total CHOL/HDL Ratio 4    NonHDL 177.24   TSH  Result Value Ref Range   TSH 1.19 0.35 - 4.50 uIU/mL   The 10-year ASCVD risk score Mikey Bussing DC Jr., et al., 2013) is: 1.5%   Values used to calculate the score:     Age: 76 years     Sex: Female     Is Non-Hispanic African American: No     Diabetic: No     Tobacco smoker: No     Systolic Blood Pressure: 790 mmHg     Is BP treated: No     HDL Cholesterol: 54.3 mg/dL     Total Cholesterol: 232 mg/dL

## 2020-02-17 ENCOUNTER — Ambulatory Visit: Payer: 59 | Admitting: Family Medicine

## 2020-02-17 ENCOUNTER — Other Ambulatory Visit: Payer: Self-pay

## 2020-02-17 ENCOUNTER — Encounter: Payer: Self-pay | Admitting: Family Medicine

## 2020-02-17 VITALS — BP 108/78 | HR 66 | Temp 97.7°F | Resp 16 | Ht 68.0 in | Wt 168.0 lb

## 2020-02-17 DIAGNOSIS — Z1329 Encounter for screening for other suspected endocrine disorder: Secondary | ICD-10-CM | POA: Diagnosis not present

## 2020-02-17 DIAGNOSIS — Z6379 Other stressful life events affecting family and household: Secondary | ICD-10-CM

## 2020-02-17 DIAGNOSIS — F5101 Primary insomnia: Secondary | ICD-10-CM | POA: Diagnosis not present

## 2020-02-17 DIAGNOSIS — Z131 Encounter for screening for diabetes mellitus: Secondary | ICD-10-CM | POA: Diagnosis not present

## 2020-02-17 DIAGNOSIS — Z13220 Encounter for screening for lipoid disorders: Secondary | ICD-10-CM | POA: Diagnosis not present

## 2020-02-17 DIAGNOSIS — R7303 Prediabetes: Secondary | ICD-10-CM

## 2020-02-17 LAB — COMPREHENSIVE METABOLIC PANEL
ALT: 51 U/L — ABNORMAL HIGH (ref 0–35)
AST: 30 U/L (ref 0–37)
Albumin: 4.5 g/dL (ref 3.5–5.2)
Alkaline Phosphatase: 55 U/L (ref 39–117)
BUN: 13 mg/dL (ref 6–23)
CO2: 33 mEq/L — ABNORMAL HIGH (ref 19–32)
Calcium: 10 mg/dL (ref 8.4–10.5)
Chloride: 102 mEq/L (ref 96–112)
Creatinine, Ser: 0.79 mg/dL (ref 0.40–1.20)
GFR: 75.83 mL/min (ref >60.00)
Glucose, Bld: 102 mg/dL — ABNORMAL HIGH (ref 70–99)
Potassium: 3.9 mEq/L (ref 3.5–5.1)
Sodium: 140 mEq/L (ref 135–145)
Total Bilirubin: 0.5 mg/dL (ref 0.2–1.2)
Total Protein: 7.2 g/dL (ref 6.0–8.3)

## 2020-02-17 LAB — LIPID PANEL
Cholesterol: 232 mg/dL — ABNORMAL HIGH (ref 0–200)
HDL: 54.3 mg/dL (ref >39.00)
LDL Cholesterol: 158 mg/dL — ABNORMAL HIGH (ref 0–99)
NonHDL: 177.24
Total CHOL/HDL Ratio: 4
Triglycerides: 97 mg/dL (ref 0.0–149.0)
VLDL: 19.4 mg/dL (ref 0.0–40.0)

## 2020-02-17 LAB — TSH: TSH: 1.19 u[IU]/mL (ref 0.35–4.50)

## 2020-02-17 MED ORDER — ALPRAZOLAM 0.25 MG PO TABS: 0.2500 mg | ORAL_TABLET | Freq: Three times a day (TID) | ORAL | 0 refills | Status: DC | PRN

## 2020-02-17 MED ORDER — TRAZODONE HCL 50 MG PO TABS: 25.0000 mg | ORAL_TABLET | Freq: Every evening | ORAL | 3 refills | Status: DC | PRN

## 2020-02-18 ENCOUNTER — Encounter: Payer: Self-pay | Admitting: Family Medicine

## 2020-02-18 DIAGNOSIS — R7303 Prediabetes: Secondary | ICD-10-CM | POA: Insufficient documentation

## 2020-02-18 LAB — HEMOGLOBIN A1C: Hgb A1c MFr Bld: 6.2 % (ref 4.6–6.5)

## 2020-03-10 ENCOUNTER — Other Ambulatory Visit: Payer: Self-pay | Admitting: Family Medicine

## 2020-03-10 DIAGNOSIS — F5101 Primary insomnia: Secondary | ICD-10-CM

## 2020-03-30 NOTE — Progress Notes (Signed)
CARDIOLOGY OFFICE NOTE  Date:  04/13/2020    Lynn Schmidt Date of Birth: 09/26/1965 Medical Record #[id number]  PCP:  Darreld Mclean, MD  Cardiologist:  Servando Snare   Chief Complaint  Patient presents with  . Chest Pain    New patient visit    History of Present Illness: Lynn Schmidt is a 54 y.o. female who presents today for a new patient visit.   She has a history of diverticulitis with prior belly pain. Found to have coronary calcification/aortic atherosclerosis. Has had chronic issues with insomnia, weight gain with menopause and stress with some family issues noted. Other issues include HLD and borderline DM.   Comes in today. Here alone. 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 She notes that she started having right shoulder surgery about a year ago - saw Dr. Onnie Graham - had an injection and did some PT with some relief. For the past several months, has had some right upper chest discomfort - that she feels is related to her shoulder. She is borderline diabetic. Her FH is positive for CAD with her father. Her lipids are not at goal. She reports being quite fatigued. She is short of breath with exertion. Not at rest. Has not been really exercising and is trying to get back into a routine. She was going to try and work her lipids thru diet and exercise. She is agreeable to being on statin therapy. She notes that she had been taking lots of Tylenol and this may explain the elevation in her LFTs. Not dizzy. No syncope. No palpitations noted.   Past Medical History:  Diagnosis Date  . Anemia   . Anxiety   . Arthritis   . Diverticulitis   . Dysmenorrhea   . Dyspareunia   . Fibroid   . Urinary incontinence    occ. with sneezing    Past Surgical History:  Procedure Laterality Date  . ABDOMINAL HYSTERECTOMY N/A 03/29/2015   Procedure: HYSTERECTOMY ABDOMINAL;  Surgeon: Nunzio Cobbs, MD;  Location: [facility];  Service: Gynecology;  Laterality: N/A;  .  BILATERAL SALPINGECTOMY Bilateral 03/29/2015   Procedure: BILATERAL SALPINGECTOMY;  Surgeon: Nunzio Cobbs, MD;  Location: [facility];  Service: Gynecology;  Laterality: Bilateral;  . CYSTOSCOPY N/A 03/29/2015   Procedure: CYSTOSCOPY;  Surgeon: Nunzio Cobbs, MD;  Location: [facility];  Service: Gynecology;  Laterality: N/A;     Medications: Current Meds  Medication Sig  . acetaminophen (TYLENOL) 500 MG tablet Take 2 tablets (1,000 mg total) by mouth every 6 (six) hours as needed for mild pain.  Marland Kitchen ALPRAZolam (XANAX) 0.25 MG tablet Take 1 tablet (0.25 mg total) by mouth 3 (three) times daily as needed for anxiety.  . diclofenac Sodium (VOLTAREN) 1 % GEL Apply topically 4 (four) times daily.  Marland Kitchen doxylamine, Sleep, (UNISOM) 25 MG tablet Take 25 mg by mouth at bedtime as needed.  Marland Kitchen esomeprazole (NEXIUM) 40 MG capsule Take 40 mg by mouth daily at 12 noon.  . Multiple Vitamin (MULTIVITAMIN ADULT PO) Take by mouth.  . naphazoline-pheniramine (NAPHCON-A) 0.025-0.3 % ophthalmic solution Place 1 drop into both eyes daily as needed for irritation or allergies.  . pseudoephedrine (SUDAFED) 30 MG tablet Take 30 mg by mouth daily as needed (sinus pressure).   . traZODone (DESYREL) 50 MG tablet TAKE 1/2-1 TABLETS (25-50 MG TOTAL) BY MOUTH AT BEDTIME AS NEEDED FOR SLEEP.  Marland Kitchen UNABLE TO FIND Take  1 tablet by mouth as needed. Med Name: Ashwagandha Supplement/calming inflammation     Allergies: Allergies  Allergen Reactions  . Asa [Aspirin] Other (See Comments)    Gastritis.  . Ciprofloxacin   . Flagyl [Metronidazole] Hives  . Ibuprofen Other (See Comments)    Gastritis.  Marland Kitchen Penicillins Hives    Hasn't taken since she was a child    Social History: The patient  reports that she has never smoked. She has never used smokeless tobacco. She reports current alcohol use. She reports that she does not use drugs.   Family History: The patient's family history includes Breast cancer (age of  onset: 38) in her mother; Cancer (age of onset: 97) in her paternal grandmother; Cancer (age of onset: 79) in her father; Heart attack in her mother; Hyperlipidemia in her father and mother; Hypertension in her father and mother. Her mother has never had an MI. Her father had an MI -   Review of Systems: Please see the history of present illness.   All other systems are reviewed and negative.   Physical Exam: VS:  BP 108/80 (BP Location: Left Arm, Patient Position: Sitting, Cuff Size: Large)   Pulse 79   Ht 5\' 8"  (1.727 m)   Wt 170 lb 3.2 oz (77.2 kg)   LMP 03/25/2015   SpO2 97% Comment: at rest  BMI 25.88 kg/m  .  BMI Body mass index is 25.88 kg/m.  Wt Readings from Last 3 Encounters:  04/13/20 170 lb 3.2 oz (77.2 kg)  02/17/20 168 lb (76.2 kg)  12/22/19 169 lb (76.7 kg)    General: Pleasant. Alert and in no acute distress.   HEENT: Normal.  Neck: Supple, no JVD, carotid bruits, or masses noted.  Cardiac: Regular rate and rhythm. No murmurs, rubs, or gallops. No edema.  Respiratory:  Lungs are clear to auscultation bilaterally with normal work of breathing.  GI: Soft and nontender.  MS: No deformity or atrophy. Gait and ROM intact.  Skin: Warm and dry. Color is normal.  Neuro:  Strength and sensation are intact and no gross focal deficits noted.  Psych: Alert, appropriate and with normal affect.   LABORATORY DATA:  EKG:  EKG is ordered today.  Personally reviewed by me. This demonstrates NSR.  Lab Results  Component Value Date   WBC 8.0 12/22/2019   HGB 12.4 12/22/2019   HCT 37.4 12/22/2019   PLT 367 12/22/2019   GLUCOSE 102 (H) 02/17/2020   CHOL 232 (H) 02/17/2020   TRIG 97.0 02/17/2020   HDL 54.30 02/17/2020   LDLCALC 158 (H) 02/17/2020   ALT 51 (H) 02/17/2020   AST 30 02/17/2020   NA 140 02/17/2020   K 3.9 02/17/2020   CL 102 02/17/2020   CREATININE 0.79 02/17/2020   BUN 13 02/17/2020   CO2 33 (H) 02/17/2020   TSH 1.19 02/17/2020   HGBA1C 6.2 02/17/2020       BNP (last 3 results) No results for input(s): BNP in the last 8760 hours.  ProBNP (last 3 results) No results for input(s): PROBNP in the last 8760 hours.   Other Studies Reviewed Today:  CT ABDOMEN/PELVIS IMPRESSION 12/2019: 1. Mild local diverticulitis of the proximal sigmoid colon adjacent to the vaginal cuff. No extraluminal gas or abscess. 2. Borderline wall thickening of the gallbladder. 3. Mild mosaic attenuation in the lung bases probably from air trapping, less likely extrinsic allergic alveolitis. 4. Aortic atherosclerosis.  Aortic Atherosclerosis (ICD10-I70.0).   Electronically Signed   By:  Van Clines M.D.   On: 12/23/2019 13:50    Assessment/Plan:  1. Aortic atherosclerosis - noted on prior CT while having abdominal pain - needs aggressive CV risk factor modification.   2. Right sided chest pain - multiple risk factors - will arrange for coronary CT.   3. Diverticulitis - this is resolved. She is trying to be more careful with her diet.   4. HLD - not treated - has known aortic atherosclerosis noted on prior CT - would favor lipid lowering therapy - she is agreeable - rechecking LFTs today and most likely start Lipitor or Crestor.   5. Mild elevation in ALT - rechecking today - see above.   6. Borderline DM - will need aggressive treatment with diet/exercise/weight loss - advise less carbs and sugars - she admits she likes Diet Coke. She does seem very motivated to make changes.   7. Situational family stress - she will have some issues to deal with and understands that.    Current medicines are reviewed with the patient today.  The patient does not have concerns regarding medicines other than what has been noted above.  The following changes have been made:  See above.  Labs/ tests ordered today include:    Orders Placed This Encounter  Procedures  . CT CORONARY MORPH W/CTA COR W/SCORE W/CA W/CM &/OR WO/CM  . CT CORONARY FRACTIONAL  FLOW RESERVE DATA PREP  . CT CORONARY FRACTIONAL FLOW RESERVE FLUID ANALYSIS  . Basic metabolic panel  . Hepatic function panel     Disposition:   Further disposition pending. Will see how her studies turn out.   Patient is agreeable to this plan and will call if any problems develop in the interim.   SignedTruitt Merle, NP  04/13/2020 3:02 PM  [facility] [address] [place], [place]  [address] Phone: [phone] Fax: [phone]

## 2020-04-13 ENCOUNTER — Other Ambulatory Visit: Payer: Self-pay

## 2020-04-13 ENCOUNTER — Ambulatory Visit (INDEPENDENT_AMBULATORY_CARE_PROVIDER_SITE_OTHER): Payer: No Typology Code available for payment source | Admitting: Nurse Practitioner

## 2020-04-13 ENCOUNTER — Encounter: Payer: Self-pay | Admitting: Nurse Practitioner

## 2020-04-13 VITALS — BP 108/80 | HR 79 | Ht 68.0 in | Wt 170.2 lb

## 2020-04-13 DIAGNOSIS — E7849 Other hyperlipidemia: Secondary | ICD-10-CM | POA: Diagnosis not present

## 2020-04-13 DIAGNOSIS — R7401 Elevation of levels of liver transaminase levels: Secondary | ICD-10-CM

## 2020-04-13 DIAGNOSIS — I259 Chronic ischemic heart disease, unspecified: Secondary | ICD-10-CM | POA: Diagnosis not present

## 2020-04-13 MED ORDER — METOPROLOL TARTRATE 100 MG PO TABS: 100.0000 mg | ORAL_TABLET | ORAL | 0 refills | Status: DC

## 2020-04-13 NOTE — Patient Instructions (Addendum)
After Visit Summary:  We will be checking the following labs today - BMET and LFTs   Medication Instructions:    Continue with your current medicines.   Once I see your labs - I will start you on cholesterol medicine   If you need a refill on your cardiac medications before your next appointment, please call your pharmacy.     Testing/Procedures To Be Arranged:  Coronary CT  Your cardiac CT will be scheduled at one of the below locations:   [facility] [address] [place], [place] [address] [phone]  If scheduled at [facility], please arrive at the [facility] main entrance of [facility] 30 minutes prior to test start time. Proceed to the [facility] Radiology Department (first floor) to check-in and test prep.  Please follow these instructions carefully (unless otherwise directed):  On the Night Before the Test: . Be sure to Drink plenty of water. . Do not consume any caffeinated/decaffeinated beverages or chocolate 12 hours prior to your test. . Do not take any antihistamines 12 hours prior to your test.   On the Day of the Test: . Drink plenty of water. Do not drink any water within one hour of the test. . Do not eat any food 4 hours prior to the test. . You may take your regular medications prior to the test.  . Take metoprolol (Lopressor) 100 mg two hours prior to test. . FEMALES- please wear underwire-free bra if available         After the Test: . Drink plenty of water. . After receiving IV contrast, you may experience a mild flushed feeling. This is normal. . On occasion, you may experience a mild rash up to 24 hours after the test. This is not dangerous. If this occurs, you can take Benadryl 25 mg and increase your fluid intake. . If you experience trouble breathing, this can be serious. If it is severe call 911 IMMEDIATELY. If it is mild, please call our office.    Once we have confirmed authorization from your  insurance company, we will call you to set up a date and time for your test. Based on how quickly your insurance processes prior authorizations requests, please allow up to 4 weeks to be contacted for scheduling your Cardiac CT appointment. Be advised that routine Cardiac CT appointments could be scheduled as many as 8 weeks after your provider has ordered it.  For non-scheduling related questions, please contact the cardiac imaging nurse navigator should you have any questions/concerns: Marchia Bond, Cardiac Imaging Nurse Navigator Burley Saver, Interim Cardiac Imaging Nurse [facility] Direct Office Dial: [phone]   For scheduling needs, including cancellations and rescheduling, please call Vivien Rota at [phone], option 3.     Follow-Up:   Let's see how your studies turn out.     At [facility], you and your health needs are our priority.  As part of our continuing mission to provide you with exceptional heart care, we have created designated Provider Care Teams.  These Care Teams include your primary Cardiologist (physician) and Advanced Practice Providers (APPs -  Physician Assistants and Nurse Practitioners) who all work together to provide you with the care you need, when you need it.  Special Instructions:  . Stay safe, wash your hands for at least 20 seconds and wear a mask when needed.  . It was good to talk with you today.    Call the  [facility] office at [phone] if you have any questions, problems or concerns.

## 2020-04-14 ENCOUNTER — Telehealth: Payer: Self-pay | Admitting: *Deleted

## 2020-04-14 DIAGNOSIS — Z79899 Other long term (current) drug therapy: Secondary | ICD-10-CM

## 2020-04-14 LAB — BASIC METABOLIC PANEL
BUN/Creatinine Ratio: 15 (ref 9–23)
BUN: 16 mg/dL (ref 6–24)
CO2: 28 mmol/L (ref 20–29)
Calcium: 9.6 mg/dL (ref 8.7–10.2)
Chloride: 101 mmol/L (ref 96–106)
Creatinine, Ser: 1.06 mg/dL — ABNORMAL HIGH (ref 0.57–1.00)
GFR calc Af Amer: 69 mL/min/{1.73_m2} (ref >59)
GFR calc non Af Amer: 60 mL/min/{1.73_m2} (ref >59)
Glucose: 91 mg/dL (ref 65–99)
Potassium: 4.1 mmol/L (ref 3.5–5.2)
Sodium: 140 mmol/L (ref 134–144)

## 2020-04-14 LAB — HEPATIC FUNCTION PANEL
ALT: 36 IU/L — ABNORMAL HIGH (ref 0–32)
AST: 33 IU/L (ref 0–40)
Albumin: 4.7 g/dL (ref 3.8–4.9)
Alkaline Phosphatase: 65 IU/L (ref 48–121)
Bilirubin Total: <0.2 mg/dL (ref 0.0–1.2)
Bilirubin, Direct: 0.07 mg/dL (ref 0.00–0.40)
Total Protein: 7.4 g/dL (ref 6.0–8.5)

## 2020-04-14 MED ORDER — ROSUVASTATIN CALCIUM 10 MG PO TABS: 10.0000 mg | ORAL_TABLET | Freq: Every day | ORAL | 3 refills | Status: DC

## 2020-04-14 NOTE — Telephone Encounter (Signed)
-----   Message from Burtis Junes, NP sent at 04/14/2020  6:49 AM EDT ----- Ok to report. Labs are stable - ALT is better.  Let's start Crestor 10 mg a day Lipids and LFTs in 6 weeks.  Otherwise continue with current regimen as outlined at her visit.

## 2020-04-18 ENCOUNTER — Other Ambulatory Visit (INDEPENDENT_AMBULATORY_CARE_PROVIDER_SITE_OTHER): Payer: No Typology Code available for payment source | Admitting: *Deleted

## 2020-04-18 DIAGNOSIS — I259 Chronic ischemic heart disease, unspecified: Secondary | ICD-10-CM

## 2020-05-06 ENCOUNTER — Telehealth (HOSPITAL_COMMUNITY): Payer: Self-pay | Admitting: Emergency Medicine

## 2020-05-06 NOTE — Telephone Encounter (Signed)
Attempted to call patient regarding upcoming cardiac CT appointment. °Left message on voicemail with name and callback number °Tempestt Silba RN Navigator Cardiac Imaging °[facility] °[phone] Office °[phone] Cell ° °

## 2020-05-10 ENCOUNTER — Ambulatory Visit (HOSPITAL_COMMUNITY)
Admission: RE | Admit: 2020-05-10 | Discharge: 2020-05-10 | Disposition: A | Discharge status: Home / Self Care | Payer: No Typology Code available for payment source | Source: Ambulatory Visit | Attending: Nurse Practitioner | Admitting: Nurse Practitioner

## 2020-05-10 ENCOUNTER — Other Ambulatory Visit: Payer: Self-pay

## 2020-05-10 DIAGNOSIS — I259 Chronic ischemic heart disease, unspecified: Secondary | ICD-10-CM

## 2020-05-10 MED ORDER — NITROGLYCERIN 0.4 MG SL SUBL: 0.8000 mg | SUBLINGUAL_TABLET | Freq: Once | SUBLINGUAL | Status: AC

## 2020-05-10 MED ORDER — IOHEXOL 350 MG/ML SOLN
80.0000 mL | Freq: Once | INTRAVENOUS | Status: AC | PRN
  Administered 2020-05-10: 80 mL via INTRAVENOUS

## 2020-05-10 MED ORDER — NITROGLYCERIN 0.4 MG SL SUBL
SUBLINGUAL_TABLET | SUBLINGUAL | Status: AC
  Administered 2020-05-10: 0.8 mg via SUBLINGUAL
  Filled 2020-05-10: qty 2

## 2020-05-30 ENCOUNTER — Other Ambulatory Visit: Payer: Self-pay | Admitting: *Deleted

## 2020-05-30 ENCOUNTER — Other Ambulatory Visit: Payer: Self-pay

## 2020-05-30 ENCOUNTER — Other Ambulatory Visit: Payer: No Typology Code available for payment source | Admitting: *Deleted

## 2020-05-30 DIAGNOSIS — E7849 Other hyperlipidemia: Secondary | ICD-10-CM

## 2020-05-30 DIAGNOSIS — Z79899 Other long term (current) drug therapy: Secondary | ICD-10-CM

## 2020-05-30 LAB — LIPID PANEL
Chol/HDL Ratio: 2.9 ratio (ref 0.0–4.4)
Cholesterol, Total: 160 mg/dL (ref 100–199)
HDL: 56 mg/dL (ref >39)
LDL Chol Calc (NIH): 87 mg/dL (ref 0–99)
Triglycerides: 90 mg/dL (ref 0–149)
VLDL Cholesterol Cal: 17 mg/dL (ref 5–40)

## 2020-05-30 LAB — HEPATIC FUNCTION PANEL
ALT: 18 IU/L (ref 0–32)
AST: 18 IU/L (ref 0–40)
Albumin: 4.5 g/dL (ref 3.8–4.9)
Alkaline Phosphatase: 56 IU/L (ref 44–121)
Bilirubin Total: 0.4 mg/dL (ref 0.0–1.2)
Bilirubin, Direct: 0.11 mg/dL (ref 0.00–0.40)
Total Protein: 7.3 g/dL (ref 6.0–8.5)

## 2020-06-14 ENCOUNTER — Encounter: Payer: Self-pay | Admitting: Family Medicine

## 2020-06-14 DIAGNOSIS — Z6379 Other stressful life events affecting family and household: Secondary | ICD-10-CM

## 2020-06-14 MED ORDER — ALPRAZOLAM 0.25 MG PO TABS: 0.2500 mg | ORAL_TABLET | Freq: Three times a day (TID) | ORAL | 0 refills | Status: DC | PRN

## 2020-09-08 LAB — HM MAMMOGRAPHY

## 2020-09-09 ENCOUNTER — Encounter: Payer: Self-pay | Admitting: *Deleted

## 2020-09-13 ENCOUNTER — Telehealth: Payer: Self-pay | Admitting: Obstetrics and Gynecology

## 2020-09-13 NOTE — Telephone Encounter (Signed)
Patient informed with below note. Patient states she will schedule a annual exam via my chart.

## 2020-09-13 NOTE — Telephone Encounter (Signed)
Please contact patient in follow up to her diagnostic mammogram and left breast ultrasound done at [facility] on 09/08/20 for spontaneous discharge of left breast.   Her mammogram and ultrasound showed no evidence of malignancy, [id number].  Please have her schedule an office visit for an annual exam with me and evaluation of her nipple discharge.   Her last annual exam was April, 2018.  Please keep in mammogram hold until she has her office visit.

## 2020-09-13 NOTE — Telephone Encounter (Signed)
Left message for patient to call.

## 2020-12-08 ENCOUNTER — Other Ambulatory Visit: Payer: Self-pay | Admitting: Family Medicine

## 2020-12-08 DIAGNOSIS — F5101 Primary insomnia: Secondary | ICD-10-CM

## 2020-12-28 ENCOUNTER — Ambulatory Visit: Payer: No Typology Code available for payment source | Attending: Internal Medicine

## 2020-12-28 DIAGNOSIS — Z23 Encounter for immunization: Secondary | ICD-10-CM

## 2020-12-28 NOTE — Progress Notes (Signed)
   Covid-19 Vaccination Clinic  Name:  IJANAE MACAPAGAL    MRN: [id number] DOB: 04/06/1966  12/28/2020  Ms. Group was observed post Covid-19 immunization for 15 minutes without incident. She was provided with Vaccine Information Sheet and instruction to access the V-Safe system.   Ms. Erby was instructed to call 911 with any severe reactions post vaccine: Marland Kitchen Difficulty breathing  . Swelling of face and throat  . A fast heartbeat  . A bad rash all over body  . Dizziness and weakness   Immunizations Administered    Name Date Dose VIS Date Route   PFIZER Comrnaty(Gray TOP) Covid-19 Vaccine 12/28/2020 11:07 AM 0.3 mL 08/11/2020 Intramuscular   Manufacturer: [place]   Lot: [id number]   NDC: [phone]

## 2021-01-02 ENCOUNTER — Other Ambulatory Visit (HOSPITAL_COMMUNITY): Payer: Self-pay

## 2021-01-02 MED ORDER — COVID-19 MRNA VAC-TRIS(PFIZER) 30 MCG/0.3ML IM SUSP
INTRAMUSCULAR | 0 refills | Status: DC
  Filled 2021-01-02: qty 0.3, 17d supply, fill #0

## 2021-05-19 NOTE — Patient Instructions (Signed)
Good to see you again today, I will be in touch with your labs as soon as possible Please consider getting the updated COVID-19 booster if not done already

## 2021-05-19 NOTE — Progress Notes (Addendum)
[facility] [address] [place], [place] [address] [phone]  Date:  05/22/2021   Name:  Lynn Schmidt   DOB:  16-Dec-1965   MRN:  [id number]  PCP:  Darreld Mclean, MD    Chief Complaint: Annual Exam (Concerns/ questions: heartburn, refills- continue to monitor Crestor/HIV/ Hep C screen due/Tdap: none in [facility]- pt thinks she has had on in the past 10 years/Zoster: interested /Pap: Sees Gyn/Flu shot: will get through work)   History of Present Illness:  Lynn Schmidt is a 55 y.o. very pleasant female patient who presents with the following:  Patient seen today for physical exam Last visit with myself June 2021  She is also seen by cardiology, history of hyperlipidemia and chest pain, pre-diabetes  She did a cardiac CT which was normal  She is started on Crestor last year, can check her lipids today Out of crestor for 2 weeks now but would like to continue using this, will refill today   Her gynecologist is Dr. Quincy Simmonds   She works as a Marine scientist in Office manager Married to [place], has 3 children; last year her 2 sons were both applying to medical school They are both at [employer] in [place], and her daughter is a Therapist, music in [place]  She enjoys exercising outdoors  Can offer HIV and hep C screening Tetanus vaccine - she thinks about 3 years ago  Shingrix- start today  Pap - per GYN  Mammogram January of this year COVID-19 booster Flu shot- tomorrow at the office  Can update labs today Last colon 08/2019  She uses trazodone at bedtime; 50 mg  She may wake up at 2 or 3 am and had some difficulty getting back to sleep  We wonder if this may be due to her menopausal symptoms.  She had a partial hysterectomy years ago, still has her ovaries.  She has noted some hot flashes She also notes more difficulty maintaining her weight  Wt Readings from Last 3 Encounters:  05/22/21 167 lb 3.2 oz  (75.8 kg)  04/13/20 170 lb 3.2 oz (77.2 kg)  02/17/20 168 lb (76.2 kg)     Patient Active Problem List   Diagnosis Date Noted   Prediabetes 02/18/2020   Status post total abdominal hysterectomy 03/29/2015   MYALGIA 02/16/2011    Past Medical History:  Diagnosis Date   Anemia    Anxiety    Arthritis    Diverticulitis    Dysmenorrhea    Dyspareunia    Fibroid    Urinary incontinence    occ. with sneezing    Past Surgical History:  Procedure Laterality Date   ABDOMINAL HYSTERECTOMY N/A 03/29/2015   Procedure: HYSTERECTOMY ABDOMINAL;  Surgeon: Nunzio Cobbs, MD;  Location: [facility];  Service: Gynecology;  Laterality: N/A;   BILATERAL SALPINGECTOMY Bilateral 03/29/2015   Procedure: BILATERAL SALPINGECTOMY;  Surgeon: Nunzio Cobbs, MD;  Location: [facility];  Service: Gynecology;  Laterality: Bilateral;   CYSTOSCOPY N/A 03/29/2015   Procedure: CYSTOSCOPY;  Surgeon: Nunzio Cobbs, MD;  Location: [facility];  Service: Gynecology;  Laterality: N/A;    Social History   Tobacco Use   Smoking status: Never   Smokeless tobacco: Never  Vaping Use   Vaping Use: Never used  Substance Use Topics   Alcohol use: Yes    Alcohol/week: 0.0 standard drinks  Comment: rarely   Drug use: No    Family History  Problem Relation Age of Onset   Heart attack Mother    Breast cancer Mother 82       Estrogen receptive   Hypertension Mother    Hyperlipidemia Mother    Cancer Father 28       Lung ca--dec age 68   Hypertension Father    Hyperlipidemia Father    Cancer Paternal Grandmother 10       colon and pancreatic ca    Allergies  Allergen Reactions   Asa [Aspirin] Other (See Comments)    Gastritis.   Ciprofloxacin    Flagyl [Metronidazole] Hives   Ibuprofen Other (See Comments)    Gastritis.   Penicillins Hives    Hasn't taken since she was a child    Medication list has been reviewed and updated.  Current Outpatient Medications on File Prior to  Visit  Medication Sig Dispense Refill   acetaminophen (TYLENOL) 500 MG tablet Take 2 tablets (1,000 mg total) by mouth every 6 (six) hours as needed for mild pain. 30 tablet 0   ALPRAZolam (XANAX) 0.25 MG tablet Take 1 tablet (0.25 mg total) by mouth 3 (three) times daily as needed for anxiety. 10 tablet 0   diclofenac Sodium (VOLTAREN) 1 % GEL Apply topically 4 (four) times daily.     doxylamine, Sleep, (UNISOM) 25 MG tablet Take 25 mg by mouth at bedtime as needed.     esomeprazole (NEXIUM) 40 MG capsule Take 40 mg by mouth daily at 12 noon.     Multiple Vitamin (MULTIVITAMIN ADULT PO) Take by mouth.     naphazoline-pheniramine (NAPHCON-A) 0.025-0.3 % ophthalmic solution Place 1 drop into both eyes daily as needed for irritation or allergies.     pseudoephedrine (SUDAFED) 30 MG tablet Take 30 mg by mouth daily as needed (sinus pressure).      traZODone (DESYREL) 50 MG tablet TAKE 1/2 TO 1 TABLET BY MOUTH AT BEDTIME AS NEEDED FOR SLEEP 90 tablet 2   metoprolol tartrate (LOPRESSOR) 100 MG tablet Take 1 tablet (100 mg total) by mouth as directed. Take 2 hours prior to CT scan 1 tablet 0   rosuvastatin (CRESTOR) 10 MG tablet Take 1 tablet (10 mg total) by mouth daily. 90 tablet 3   No current facility-administered medications on file prior to visit.    Review of Systems:  As per HPI- otherwise negative.   Physical Examination: Vitals:   05/22/21 1442  BP: 120/74  Pulse: 78  Resp: 18  Temp: 98 F (36.7 C)  SpO2: 96%   Vitals:   05/22/21 1442  Weight: 167 lb 3.2 oz (75.8 kg)  Height: '5\' 8"'$  (1.727 m)   Body mass index is 25.42 kg/m. Ideal Body Weight: Weight in (lb) to have BMI = 25: 164.1  GEN: no acute distress.  Minimal overweight, looks well  HEENT: Atraumatic, Normocephalic.   Bilateral TM wnl, oropharynx normal.  PEERL,EOMI.   Ears and Nose: No external deformity. CV: RRR, No M/G/R. No JVD. No thrill. No extra heart sounds. PULM: CTA B, no wheezes, crackles, rhonchi. No  retractions. No resp. distress. No accessory muscle use. ABD: S, NT, ND, +BS. No rebound. No HSM. EXTR: No c/c/e PSYCH: Normally interactive. Conversant.    Assessment and Plan: Physical exam  Screening for hyperlipidemia - Plan: Lipid panel  Screening for thyroid disorder - Plan: TSH  Screening for diabetes mellitus - Plan: Comprehensive metabolic panel, Hemoglobin A1c  Screening for deficiency anemia - Plan: CBC  Fatigue, unspecified type - Plan: VITAMIN D 25 Hydroxy (Vit-D Deficiency, Fractures)  Primary insomnia - Plan: traZODone (DESYREL) 50 MG tablet  Dyslipidemia - Plan: rosuvastatin (CRESTOR) 10 MG tablet  Immunization due - Plan: Varicella-zoster vaccine IM (Shingrix)  Stressful life event affecting family - Plan: ALPRAZolam (XANAX) 0.25 MG tablet  Physical exam today.  Encouraged healthy diet and exercise routine Refilled alprazolam which she uses on rare occasion for airplane flights Discussed her insomnia.  She would like to try increasing her trazodone slightly.  Provide prescription for to take up to 100 mg as needed Will plan further follow- up pending labs.   This visit occurred during the SARS-CoV-2 public health emergency.  Safety protocols were in place, including screening questions prior to the visit, additional usage of staff PPE, and extensive cleaning of exam room while observing appropriate contact time as indicated for disinfecting solutions.   Signed Lamar Blinks, MD  Received labs as below, message to pt]  Results for orders placed or performed in visit on 05/22/21  CBC  Result Value Ref Range   WBC 7.5 4.0 - 10.5 K/uL   RBC 4.61 3.87 - 5.11 Mil/uL   Platelets 346.0 150.0 - 400.0 K/uL   Hemoglobin 13.0 12.0 - 15.0 g/dL   HCT 39.7 36.0 - 46.0 %   MCV 86.0 78.0 - 100.0 fl   MCHC 32.8 30.0 - 36.0 g/dL   RDW 12.7 11.5 - 15.5 %  Comprehensive metabolic panel  Result Value Ref Range   Sodium 139 135 - 145 mEq/L   Potassium 4.0 3.5 - 5.1  mEq/L   Chloride 101 96 - 112 mEq/L   CO2 31 19 - 32 mEq/L   Glucose, Bld 85 70 - 99 mg/dL   BUN 14 6 - 23 mg/dL   Creatinine, Ser 0.94 0.40 - 1.20 mg/dL   Total Bilirubin 0.3 0.2 - 1.2 mg/dL   Alkaline Phosphatase 44 39 - 117 U/L   AST 22 0 - 37 U/L   ALT 18 0 - 35 U/L   Total Protein 7.1 6.0 - 8.3 g/dL   Albumin 4.2 3.5 - 5.2 g/dL   GFR 68.41 >60.00 mL/min   Calcium 9.6 8.4 - 10.5 mg/dL  Hemoglobin A1c  Result Value Ref Range   Hgb A1c MFr Bld 5.8 4.6 - 6.5 %  Lipid panel  Result Value Ref Range   Cholesterol 180 0 - 200 mg/dL   Triglycerides 222.0 (H) 0.0 - 149.0 mg/dL   HDL 47.60 >39.00 mg/dL   VLDL 44.4 (H) 0.0 - 40.0 mg/dL   Total CHOL/HDL Ratio 4    NonHDL 131.95   TSH  Result Value Ref Range   TSH 1.27 0.35 - 5.50 uIU/mL  VITAMIN D 25 Hydroxy (Vit-D Deficiency, Fractures)  Result Value Ref Range   VITD 28.08 (L) 30.00 - 100.00 ng/mL  LDL cholesterol, direct  Result Value Ref Range   Direct LDL 114.0 mg/dL

## 2021-05-22 ENCOUNTER — Ambulatory Visit (INDEPENDENT_AMBULATORY_CARE_PROVIDER_SITE_OTHER): Payer: No Typology Code available for payment source | Admitting: Family Medicine

## 2021-05-22 ENCOUNTER — Other Ambulatory Visit: Payer: Self-pay

## 2021-05-22 VITALS — BP 120/74 | HR 78 | Temp 98.0°F | Resp 18 | Ht 68.0 in | Wt 167.2 lb

## 2021-05-22 DIAGNOSIS — Z23 Encounter for immunization: Secondary | ICD-10-CM | POA: Diagnosis not present

## 2021-05-22 DIAGNOSIS — Z13220 Encounter for screening for lipoid disorders: Secondary | ICD-10-CM | POA: Diagnosis not present

## 2021-05-22 DIAGNOSIS — R5383 Other fatigue: Secondary | ICD-10-CM

## 2021-05-22 DIAGNOSIS — Z0000 Encounter for general adult medical examination without abnormal findings: Secondary | ICD-10-CM | POA: Diagnosis not present

## 2021-05-22 DIAGNOSIS — Z6379 Other stressful life events affecting family and household: Secondary | ICD-10-CM

## 2021-05-22 DIAGNOSIS — Z1329 Encounter for screening for other suspected endocrine disorder: Secondary | ICD-10-CM

## 2021-05-22 DIAGNOSIS — Z130 Encounter for screening for diseases of the blood and blood-forming organs and certain disorders involving the immune mechanism: Secondary | ICD-10-CM

## 2021-05-22 DIAGNOSIS — F5101 Primary insomnia: Secondary | ICD-10-CM

## 2021-05-22 DIAGNOSIS — E785 Hyperlipidemia, unspecified: Secondary | ICD-10-CM

## 2021-05-22 DIAGNOSIS — Z131 Encounter for screening for diabetes mellitus: Secondary | ICD-10-CM | POA: Diagnosis not present

## 2021-05-22 MED ORDER — ROSUVASTATIN CALCIUM 10 MG PO TABS: 10.0000 mg | ORAL_TABLET | Freq: Every day | ORAL | 3 refills | Status: DC

## 2021-05-22 MED ORDER — ALPRAZOLAM 0.25 MG PO TABS: 0.2500 mg | ORAL_TABLET | Freq: Three times a day (TID) | ORAL | 0 refills | Status: DC | PRN

## 2021-05-22 MED ORDER — TRAZODONE HCL 50 MG PO TABS: 50.0000 mg | ORAL_TABLET | Freq: Every evening | ORAL | 3 refills | Status: DC | PRN

## 2021-05-23 ENCOUNTER — Encounter: Payer: Self-pay | Admitting: Family Medicine

## 2021-05-23 LAB — VITAMIN D 25 HYDROXY (VIT D DEFICIENCY, FRACTURES): VITD: 28.08 ng/mL — ABNORMAL LOW (ref 30.00–100.00)

## 2021-05-23 LAB — COMPREHENSIVE METABOLIC PANEL
ALT: 18 U/L (ref 0–35)
AST: 22 U/L (ref 0–37)
Albumin: 4.2 g/dL (ref 3.5–5.2)
Alkaline Phosphatase: 44 U/L (ref 39–117)
BUN: 14 mg/dL (ref 6–23)
CO2: 31 mEq/L (ref 19–32)
Calcium: 9.6 mg/dL (ref 8.4–10.5)
Chloride: 101 mEq/L (ref 96–112)
Creatinine, Ser: 0.94 mg/dL (ref 0.40–1.20)
GFR: 68.41 mL/min (ref >60.00)
Glucose, Bld: 85 mg/dL (ref 70–99)
Potassium: 4 mEq/L (ref 3.5–5.1)
Sodium: 139 mEq/L (ref 135–145)
Total Bilirubin: 0.3 mg/dL (ref 0.2–1.2)
Total Protein: 7.1 g/dL (ref 6.0–8.3)

## 2021-05-23 LAB — LDL CHOLESTEROL, DIRECT: Direct LDL: 114 mg/dL

## 2021-05-23 LAB — LIPID PANEL
Cholesterol: 180 mg/dL (ref 0–200)
HDL: 47.6 mg/dL (ref >39.00)
NonHDL: 131.95
Total CHOL/HDL Ratio: 4
Triglycerides: 222 mg/dL — ABNORMAL HIGH (ref 0.0–149.0)
VLDL: 44.4 mg/dL — ABNORMAL HIGH (ref 0.0–40.0)

## 2021-05-23 LAB — CBC
HCT: 39.7 % (ref 36.0–46.0)
Hemoglobin: 13 g/dL (ref 12.0–15.0)
MCHC: 32.8 g/dL (ref 30.0–36.0)
MCV: 86 fl (ref 78.0–100.0)
Platelets: 346 10*3/uL (ref 150.0–400.0)
RBC: 4.61 Mil/uL (ref 3.87–5.11)
RDW: 12.7 % (ref 11.5–15.5)
WBC: 7.5 10*3/uL (ref 4.0–10.5)

## 2021-05-23 LAB — HEMOGLOBIN A1C: Hgb A1c MFr Bld: 5.8 % (ref 4.6–6.5)

## 2021-05-23 LAB — TSH: TSH: 1.27 u[IU]/mL (ref 0.35–5.50)

## 2021-09-11 LAB — HM MAMMOGRAPHY

## 2021-09-12 ENCOUNTER — Encounter: Payer: Self-pay | Admitting: *Deleted

## 2021-09-14 ENCOUNTER — Encounter: Payer: Self-pay | Admitting: Family Medicine

## 2021-09-20 ENCOUNTER — Ambulatory Visit (INDEPENDENT_AMBULATORY_CARE_PROVIDER_SITE_OTHER): Payer: No Typology Code available for payment source

## 2021-09-20 DIAGNOSIS — Z23 Encounter for immunization: Secondary | ICD-10-CM

## 2021-09-20 NOTE — Progress Notes (Signed)
Lynn Schmidt is a 56 y.o. female presents to the office today for Shingrix Vaccine injection, per physician's orders. Shingrix vaccine was administered in left deltoid today. Patient tolerated injection.  Gerilyn Nestle

## 2021-10-03 ENCOUNTER — Other Ambulatory Visit (HOSPITAL_COMMUNITY): Payer: Self-pay | Admitting: Gastroenterology

## 2021-10-03 ENCOUNTER — Other Ambulatory Visit: Payer: Self-pay | Admitting: Gastroenterology

## 2021-10-03 DIAGNOSIS — R1011 Right upper quadrant pain: Secondary | ICD-10-CM

## 2021-10-20 ENCOUNTER — Ambulatory Visit (HOSPITAL_COMMUNITY)
Admission: RE | Admit: 2021-10-20 | Discharge: 2021-10-20 | Disposition: A | Discharge status: Home / Self Care | Payer: No Typology Code available for payment source | Source: Ambulatory Visit | Attending: Gastroenterology | Admitting: Gastroenterology

## 2021-10-20 ENCOUNTER — Other Ambulatory Visit: Payer: Self-pay

## 2021-10-20 DIAGNOSIS — R1011 Right upper quadrant pain: Secondary | ICD-10-CM

## 2021-10-20 LAB — POCT I-STAT CREATININE: Creatinine, Ser: 0.5 mg/dL (ref 0.44–1.00)

## 2021-10-26 ENCOUNTER — Encounter: Payer: Self-pay | Admitting: Family Medicine

## 2021-11-07 ENCOUNTER — Other Ambulatory Visit: Payer: Self-pay | Admitting: Family Medicine

## 2021-11-07 DIAGNOSIS — F5101 Primary insomnia: Secondary | ICD-10-CM

## 2021-11-20 ENCOUNTER — Ambulatory Visit: Payer: Self-pay | Admitting: Surgery

## 2021-11-20 NOTE — H&P (Signed)
Subjective  ?  ?Chief Complaint: Cholelithiasis ?  ?  ?  ?History of Present Illness: ?Lynn Schmidt is a 56 y.o. female who is seen today as an office consultation at the request of Dr. Collene Mares for evaluation of Cholelithiasis ?.   ?  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  The patient has suffered from indigestion for several years.  These more recent episodes are associated with eating.  She describes a lot of postprandial nausea as well as abdominal bloating.  She has had longstanding diarrhea and carries a diagnosis of irritable bowel syndrome.  She was referred to see Dr. Collene Mares.  EGD showed some gastritis.  Ultrasound revealed multiple gallstones.  There was some mild gallbladder wall thickening but no pericholecystic fluid.  Liver function test in September 2022 showed normal liver functions. ?  ?  ?Review of Systems: ?A complete review of systems was obtained from the patient.  I have reviewed this information and discussed as appropriate with the patient.  See HPI as well for other ROS. ?  ?Review of Systems  ?Constitutional: Negative.   ?HENT: Negative.   ?Eyes: Negative.   ?Respiratory: Negative.   ?Cardiovascular: Negative.   ?Gastrointestinal: Positive for abdominal pain, heartburn and nausea.  ?Genitourinary: Positive for flank pain.  ?Musculoskeletal: Positive for back pain.  ?Skin: Negative.   ?Neurological: Negative.   ?Endo/Heme/Allergies: Negative.   ?Psychiatric/Behavioral: Negative.   ?  ?  ?  ?Medical History: ?Past Medical History  ?    ?Past Medical History:  ?Diagnosis Date  ? Anxiety    ? Arthritis    ? GERD (gastroesophageal reflux disease)    ? Hyperlipidemia    ?  ?  ?  ?There is no problem list on file for this patient. ?  ?  ?Past Surgical History  ?     ?Past Surgical History:  ?Procedure Laterality Date  ? HYSTERECTOMY      ?   ?  ?  ?Allergies  ?     ?Allergies  ?Allergen Reactions  ? Ciprofloxacin Anaphylaxis  ? Aspirin Other (See Comments)  ?    Gastritis. ?Gastritis. ?   ? Ibuprofen Other (See Comments)  ?    Gastritis. ?Gastritis. ?   ? Metronidazole Hives  ? Penicillins Hives and Rash  ?    Hasn't taken since she was a child ?Hasn't taken since she was a child ?   ?  ?  ?  ?      ?Current Outpatient Medications on File Prior to Visit  ?Medication Sig Dispense Refill  ? multivitamin with iron (CENTRUM ADULT COMPLETE) tablet Take by mouth      ? rosuvastatin (CRESTOR) 10 MG tablet        ? traZODone (DESYREL) 50 MG tablet        ?  ?No current facility-administered medications on file prior to visit.  ?  ?  ?Family History  ?     ?Family History  ?Problem Relation Age of Onset  ? Skin cancer Mother    ? High blood pressure (Hypertension) Mother    ? Hyperlipidemia (Elevated cholesterol) Mother    ? Diabetes Mother    ? Breast cancer Mother    ? Obesity Father    ? High blood pressure (Hypertension) Father    ? Hyperlipidemia (Elevated cholesterol) Father    ?  Heart valve disease Father    ? Hyperlipidemia (Elevated cholesterol) Brother    ?  ?  ?  ?Social History  ?  ?   ?Tobacco Use  ?Smoking Status Never  ?Smokeless Tobacco Never  ?  ?  ?Social History  ?Social History  ?  ?    ?Socioeconomic History  ? Marital status: Married  ?Tobacco Use  ? Smoking status: Never  ? Smokeless tobacco: Never  ?Vaping Use  ? Vaping Use: Never used  ?Substance and Sexual Activity  ? Alcohol use: Never  ? Drug use: Never  ?  ?  ?  ?Objective:  ?  ?  ?   ?Vitals:  ?  11/20/21 1004  ?BP: 110/70  ?Pulse: 95  ?Temp: 36.8 ?C (98.3 ?F)  ?SpO2: 97%  ?Weight: 71.6 kg (157 lb 12.8 oz)  ?Height: 170.2 cm ('5\' 7"'$ )  ?  ?Body mass index is 24.71 kg/m?. ?  ?Physical Exam  ?  ?Constitutional:  WDWN in NAD, conversant, no obvious deformities; lying in bed comfortably ?Eyes:  Pupils equal, round; sclera anicteric; moist conjunctiva; no lid lag ?HENT:  Oral mucosa  moist; good dentition  ?Neck:  No masses palpated, trachea midline; no thyromegaly ?Lungs:  CTA bilaterally; normal respiratory effort ?CV:  Regular rate and rhythm; no murmurs; extremities well-perfused with no edema ?Abd:  +bowel sounds, soft, mild tenderness in the right upper quadrant and epigastrium, no palpable organomegaly; no palpable hernias ?Musc:  Unable to assess gait; no apparent clubbing or cyanosis in extremities ?Lymphatic:  No palpable cervical or axillary lymphadenopathy ?Skin:  Warm, dry; no sign of jaundice ?Psychiatric - alert and oriented x 4; calm mood and affect ?  ?  ?Labs, Imaging and Diagnostic Testing: ?CLINICAL DATA:  Right upper quadrant pain ?  ?EXAM: ?ULTRASOUND ABDOMEN LIMITED RIGHT UPPER QUADRANT ?  ?COMPARISON:  CT abdomen/pelvis 12/23/2019. ?  ?FINDINGS: ?Gallbladder: ?  ?The gallbladder is impacted with shadowing gallstones. There is mild ?gallbladder wall thickening measuring up to 4 mm. There is no ?pericholecystic fluid. Sonographic Murphy's sign was reported ?negative. ?  ?Common bile duct: ?  ?Diameter: 4 mm. ?  ?Liver: ?  ?No focal lesion identified. Within normal limits in parenchymal ?echogenicity. Portal vein is patent on color Doppler imaging with ?normal direction of blood flow towards the liver. ?  ?Other: None. ?  ?IMPRESSION: ?The gallbladder is impacted with gallstones with mild gallbladder ?wall thickening but no pericholecystic fluid. Sonographic Percell Miller ?sign was reported negative. HIDA scan may be considered to evaluate ?for acute cholecystitis, as indicated. ?  ?  ?Electronically Signed ?  By: Valetta Mole M.D. ?  On: 10/20/2021 10:15 ?  ?  ?Assessment and Plan:  ?Diagnoses and all orders for this visit: ?  ?Calculus of gallbladder with chronic cholecystitis without obstruction ?  ?  ?  ?Laparoscopic cholecystectomy with intraoperative cholangiogram.The surgical procedure has been discussed with the patient.  Potential risks, benefits, alternative treatments,  and expected outcomes have been explained.  All of the patient's questions at this time have been answered.  The likelihood of reaching the patient's treatment goal is good.  The patient understand the proposed surgical procedure and wishes to proceed. ?  ?  ?No follow-ups on file. ?  ?Reginald Mangels Jearld Adjutant, MD  ?11/20/2021 ?10:45 AM ?  ? ?

## 2021-11-27 ENCOUNTER — Encounter: Payer: Self-pay | Admitting: Family Medicine

## 2021-11-27 DIAGNOSIS — Z6379 Other stressful life events affecting family and household: Secondary | ICD-10-CM

## 2021-11-27 MED ORDER — ALPRAZOLAM 0.25 MG PO TABS: 0.2500 mg | ORAL_TABLET | Freq: Three times a day (TID) | ORAL | 0 refills | Status: DC | PRN

## 2021-12-27 NOTE — Pre-Procedure Instructions (Signed)
Surgical Instructions ? ? ? Your procedure is scheduled on Thursday, May 4th. ? Report to [facility] Main Entrance "A" at 05:30 A.M., then check in with the Admitting office. ? Call this number if you have problems the morning of surgery: ? [phone] ? ? If you have any questions prior to your surgery date call [phone]: Open Monday-Friday 8am-4pm ? ? ? Remember: ? Do not eat after midnight the night before your surgery ? ?You may drink clear liquids until 04:30 AM the morning of your surgery.   ?Clear liquids allowed are: Water, Non-Citrus Juices (without pulp), Carbonated Beverages, Clear Tea, Black Coffee Only (NO MILK, CREAM OR POWDERED CREAMER of any kind), and Gatorade. ? ? ?Patient Instructions ? ?The night before surgery:  ?No food after midnight. ONLY clear liquids after midnight ? ?The day of surgery (if you do NOT have diabetes):  ?Drink ONE (1) Pre-Surgery Clear Ensure by 04:30 AM the morning of surgery. Drink in one sitting. Do not sip.  ?This drink was given to you during your hospital  ?pre-op appointment visit. ? ?Nothing else to drink after completing the  ?Pre-Surgery Clear Ensure. ? ? ?       If you have questions, please contact your surgeon?s office.  ? ?  ? Take these medicines the morning of surgery with A SIP OF WATER  ? ?If needed: ?acetaminophen (TYLENOL)  ?ALPRAZolam Duanne Moron) ?naphazoline-pheniramine (NAPHCON-A) eye drops ? ?As of today, STOP taking any Aspirin (unless otherwise instructed by your surgeon) Aleve, Naproxen, Ibuprofen, Motrin, Advil, Goody's, BC's, all herbal medications, fish oil, and all vitamins. ?         ?           ?Do NOT Smoke (Tobacco/Vaping) for 24 hours prior to your procedure. ? ?If you use a CPAP at night, you may bring your mask/headgear for your overnight stay. ?  ?Contacts, glasses, piercing's, hearing aid's, dentures or partials may not be worn into surgery, please bring cases for these belongings.  ?  ?For patients admitted to the hospital, discharge  time will be determined by your treatment team. ?  ?Patients discharged the day of surgery will not be allowed to drive home, and someone needs to stay with them for 24 hours. ? ?SURGICAL WAITING ROOM VISITATION ?Patients having surgery or a procedure may have two support people in the waiting room. These visitors may be switched out with other visitors if needed. ?Children under the age of 32 must have an adult accompany them who is not the patient. ?If the patient needs to stay at the hospital during part of their recovery, the visitor guidelines for inpatient rooms apply. ? ?Please refer to the [facility] website for the visitor guidelines for Inpatients (after your surgery is over and you are in a regular room).  ? ? ?Special instructions:   ?[facility]- Preparing For Surgery ? ?Before surgery, you can play an important role. Because skin is not sterile, your skin needs to be as free of germs as possible. You can reduce the number of germs on your skin by washing with CHG (chlorahexidine gluconate) Soap before surgery.  CHG is an antiseptic cleaner which kills germs and bonds with the skin to continue killing germs even after washing.   ? ?Oral Hygiene is also important to reduce your risk of infection.  Remember - BRUSH YOUR TEETH THE MORNING OF SURGERY WITH YOUR REGULAR TOOTHPASTE ? ?Please do not use if you have an allergy to CHG or  antibacterial soaps. If your skin becomes reddened/irritated stop using the CHG.  ?Do not shave (including legs and underarms) for at least 48 hours prior to first CHG shower. It is OK to shave your face. ? ?Please follow these instructions carefully. ?  ?Shower the NIGHT BEFORE SURGERY and the MORNING OF SURGERY ? ?If you chose to wash your hair, wash your hair first as usual with your normal shampoo. ? ?After you shampoo, rinse your hair and body thoroughly to remove the shampoo. ? ?Use CHG Soap as you would any other liquid soap. You can apply CHG directly to the skin and wash  gently with a scrungie or a clean washcloth.  ? ?Apply the CHG Soap to your body ONLY FROM THE NECK DOWN.  Do not use on open wounds or open sores. Avoid contact with your eyes, ears, mouth and genitals (private parts). Wash Face and genitals (private parts)  with your normal soap.  ? ?Wash thoroughly, paying special attention to the area where your surgery will be performed. ? ?Thoroughly rinse your body with warm water from the neck down. ? ?DO NOT shower/wash with your normal soap after using and rinsing off the CHG Soap. ? ?Pat yourself dry with a CLEAN TOWEL. ? ?Wear CLEAN PAJAMAS to bed the night before surgery ? ?Place CLEAN SHEETS on your bed the night before your surgery ? ?DO NOT SLEEP WITH PETS. ? ? ?Day of Surgery: ?Take a shower with CHG soap. ?Do not wear jewelry or makeup ?Do not wear lotions, powders, perfumes, or deodorant. ?Do not shave 48 hours prior to surgery.   ?Do not bring valuables to the hospital.  ?[facility] is not responsible for any belongings or valuables. ?Do not wear nail polish, gel polish, artificial nails, or any other type of covering on natural nails (fingers and toes) ?If you have artificial nails or gel coating that need to be removed by a nail salon, please have this removed prior to surgery. Artificial nails or gel coating may interfere with anesthesia's ability to adequately monitor your vital signs. ?Wear Clean/Comfortable clothing the morning of surgery ?Remember to brush your teeth WITH YOUR REGULAR TOOTHPASTE. ?  ?Please read over the following fact sheets that you were given. ? ? ? ?If you received a COVID test during your pre-op visit  it is requested that you wear a mask when out in public, stay away from anyone that may not be feeling well and notify your surgeon if you develop symptoms. If you have been in contact with anyone that has tested positive in the last 10 days please notify you surgeon.  ?

## 2021-12-28 ENCOUNTER — Encounter (HOSPITAL_COMMUNITY)
Admission: RE | Admit: 2021-12-28 | Discharge: 2021-12-28 | Disposition: A | Discharge status: Home / Self Care | Payer: No Typology Code available for payment source | Source: Ambulatory Visit | Attending: Surgery | Admitting: Surgery

## 2021-12-28 ENCOUNTER — Encounter (HOSPITAL_COMMUNITY): Payer: Self-pay

## 2021-12-28 ENCOUNTER — Other Ambulatory Visit: Payer: Self-pay

## 2021-12-28 VITALS — BP 123/83 | HR 80 | Temp 97.8°F | Resp 17 | Ht 67.0 in | Wt 156.0 lb

## 2021-12-28 DIAGNOSIS — Z01818 Encounter for other preprocedural examination: Secondary | ICD-10-CM

## 2021-12-28 DIAGNOSIS — Z01812 Encounter for preprocedural laboratory examination: Secondary | ICD-10-CM | POA: Insufficient documentation

## 2021-12-28 LAB — CBC
HCT: 40.6 % (ref 36.0–46.0)
Hemoglobin: 13.4 g/dL (ref 12.0–15.0)
MCH: 29.4 pg (ref 26.0–34.0)
MCHC: 33 g/dL (ref 30.0–36.0)
MCV: 89 fL (ref 80.0–100.0)
Platelets: 311 10*3/uL (ref 150–400)
RBC: 4.56 MIL/uL (ref 3.87–5.11)
RDW: 12.5 % (ref 11.5–15.5)
WBC: 6.2 10*3/uL (ref 4.0–10.5)
nRBC: 0 % (ref 0.0–0.2)

## 2021-12-28 NOTE — Progress Notes (Signed)
PCP:  Lamar Blinks, MD ?Cardiologist:  denies ? ?EKG:  na ?CXR:  na ?ECHO:  denies ?Stress Test:  denies ?Cardiac Cath:  denies ? ?Fasting Blood Sugar-  na ?Checks Blood Sugar__na_ times a day ? ?ASA/Blood Thinner: No ? ?OSA/CPAP: No ? ?Covid test not needed ? ?Anesthesia Review: No ? ?Patient denies shortness of breath, fever, cough, and chest pain at PAT appointment. ? ?Patient verbalized understanding of instructions provided today at the PAT appointment.  Patient asked to review instructions at home and day of surgery.   ?

## 2022-01-03 NOTE — Anesthesia Preprocedure Evaluation (Addendum)
Anesthesia Evaluation  ?Patient identified by MRN, date of birth, ID band ?Patient awake ? ? ? ?Reviewed: ?Allergy & Precautions, NPO status , Patient's Chart, lab work & pertinent test results ? ?Airway ?Mallampati: II ? ?TM Distance: >3 FB ?Neck ROM: Full ? ? ? Dental ?no notable dental hx. ? ?  ?Pulmonary ?neg pulmonary ROS,  ?  ?Pulmonary exam normal ? ? ? ? ? ? ? Cardiovascular ?negative cardio ROS ? ? ?Rhythm:Regular Rate:Normal ? ? ?  ?Neuro/Psych ?Anxiety negative neurological ROS ?   ? GI/Hepatic ?Neg liver ROS, Chronic calculus cholecystitis  ?  ?Endo/Other  ?negative endocrine ROS ? Renal/GU ?negative Renal ROS  ?negative genitourinary ?  ?Musculoskeletal ? ?(+) Arthritis , Osteoarthritis,   ? Abdominal ?Normal abdominal exam  (+)   ?Peds ? Hematology ? ?(+) Blood dyscrasia, anemia ,   ?Anesthesia Other Findings ? ? Reproductive/Obstetrics ? ?  ? ? ? ? ? ? ? ? ? ? ? ? ? ?  ?  ? ? ? ? ? ? ? ?Anesthesia Physical ?Anesthesia Plan ? ?ASA: 2 ? ?Anesthesia Plan: General  ? ?Post-op Pain Management: Celebrex PO (pre-op)* and Tylenol PO (pre-op)*  ? ?Induction: Intravenous ? ?PONV Risk Score and Plan: 3 and Ondansetron, Dexamethasone and Midazolam ? ?Airway Management Planned: Mask and Oral ETT ? ?Additional Equipment: None ? ?Intra-op Plan:  ? ?Post-operative Plan: Extubation in OR ? ?Informed Consent: I have reviewed the patients History and Physical, chart, labs and discussed the procedure including the risks, benefits and alternatives for the proposed anesthesia with the patient or authorized representative who has indicated his/her understanding and acceptance.  ? ? ? ?Dental advisory given ? ?Plan Discussed with: CRNA ? ?Anesthesia Plan Comments: (Lab Results ?     Component                Value               Date                 ?     WBC                      6.2                 12/28/2021           ?     HGB                      13.4                12/28/2021           ?      HCT                      40.6                12/28/2021           ?     MCV                      89.0                12/28/2021           ?     PLT                      311  12/28/2021           ?Lab Results ?     Component                Value               Date                 ?     NA                       139                 05/22/2021           ?     K                        4.0                 05/22/2021           ?     CO2                      31                  05/22/2021           ?     GLUCOSE                  85                  05/22/2021           ?     BUN                      14                  05/22/2021           ?     CREATININE               0.50                10/20/2021           ?     CALCIUM                  9.6                 05/22/2021           ?     GFRNONAA                 60                  04/13/2020          )  ? ? ? ? ? ?Anesthesia Quick Evaluation ? ?

## 2022-01-04 ENCOUNTER — Ambulatory Visit (HOSPITAL_COMMUNITY): Payer: No Typology Code available for payment source | Admitting: Certified Registered"

## 2022-01-04 ENCOUNTER — Ambulatory Visit (HOSPITAL_COMMUNITY)
Admission: RE | Admit: 2022-01-04 | Discharge: 2022-01-04 | Disposition: A | Discharge status: Home / Self Care | Payer: No Typology Code available for payment source | Attending: Surgery | Admitting: Surgery

## 2022-01-04 ENCOUNTER — Other Ambulatory Visit: Payer: Self-pay

## 2022-01-04 ENCOUNTER — Encounter (HOSPITAL_COMMUNITY)
Admission: RE | Disposition: A | Discharge status: Home / Self Care | Payer: Self-pay | Source: Home / Self Care | Attending: Surgery

## 2022-01-04 ENCOUNTER — Ambulatory Visit (HOSPITAL_COMMUNITY): Payer: No Typology Code available for payment source

## 2022-01-04 ENCOUNTER — Ambulatory Visit (HOSPITAL_BASED_OUTPATIENT_CLINIC_OR_DEPARTMENT_OTHER): Payer: No Typology Code available for payment source | Admitting: Certified Registered"

## 2022-01-04 ENCOUNTER — Encounter (HOSPITAL_COMMUNITY): Payer: Self-pay | Admitting: Surgery

## 2022-01-04 DIAGNOSIS — K8018 Calculus of gallbladder with other cholecystitis without obstruction: Secondary | ICD-10-CM

## 2022-01-04 DIAGNOSIS — F419 Anxiety disorder, unspecified: Secondary | ICD-10-CM | POA: Insufficient documentation

## 2022-01-04 DIAGNOSIS — K8010 Calculus of gallbladder with chronic cholecystitis without obstruction: Secondary | ICD-10-CM | POA: Insufficient documentation

## 2022-01-04 HISTORY — PX: CHOLECYSTECTOMY: SHX55

## 2022-01-04 SURGERY — LAPAROSCOPIC CHOLECYSTECTOMY WITH INTRAOPERATIVE CHOLANGIOGRAM
Anesthesia: General

## 2022-01-04 MED ORDER — OXYCODONE HCL 5 MG PO TABS
ORAL_TABLET | ORAL | Status: AC
  Filled 2022-01-04: qty 1

## 2022-01-04 MED ORDER — ROCURONIUM BROMIDE 10 MG/ML (PF) SYRINGE
PREFILLED_SYRINGE | INTRAVENOUS | Status: DC | PRN
Start: 2022-01-04 — End: 2022-01-04
  Administered 2022-01-04: 20 mg via INTRAVENOUS
  Administered 2022-01-04: 50 mg via INTRAVENOUS

## 2022-01-04 MED ORDER — ROCURONIUM BROMIDE 10 MG/ML (PF) SYRINGE
PREFILLED_SYRINGE | INTRAVENOUS | Status: AC
  Filled 2022-01-04: qty 10

## 2022-01-04 MED ORDER — LACTATED RINGERS IV SOLN: INTRAVENOUS | Status: DC

## 2022-01-04 MED ORDER — SODIUM CHLORIDE 0.9 % IR SOLN
Status: DC | PRN
  Administered 2022-01-04: 1000 mL

## 2022-01-04 MED ORDER — ACETAMINOPHEN 500 MG PO TABS
ORAL_TABLET | ORAL | Status: AC
  Filled 2022-01-04: qty 2

## 2022-01-04 MED ORDER — PROPOFOL 10 MG/ML IV BOLUS
INTRAVENOUS | Status: AC
  Filled 2022-01-04: qty 20

## 2022-01-04 MED ORDER — FENTANYL CITRATE (PF) 250 MCG/5ML IJ SOLN
INTRAMUSCULAR | Status: DC | PRN
  Administered 2022-01-04 (×2): 50 ug via INTRAVENOUS
  Administered 2022-01-04: 100 ug via INTRAVENOUS

## 2022-01-04 MED ORDER — FENTANYL CITRATE (PF) 100 MCG/2ML IJ SOLN
25.0000 ug | INTRAMUSCULAR | Status: DC | PRN
  Administered 2022-01-04 (×3): 50 ug via INTRAVENOUS

## 2022-01-04 MED ORDER — MIDAZOLAM HCL 2 MG/2ML IJ SOLN
INTRAMUSCULAR | Status: AC
  Filled 2022-01-04: qty 2

## 2022-01-04 MED ORDER — FENTANYL CITRATE (PF) 250 MCG/5ML IJ SOLN
INTRAMUSCULAR | Status: AC
  Filled 2022-01-04: qty 5

## 2022-01-04 MED ORDER — ACETAMINOPHEN 500 MG PO TABS: 1000.0000 mg | ORAL_TABLET | Freq: Once | ORAL | Status: AC

## 2022-01-04 MED ORDER — BUPIVACAINE-EPINEPHRINE (PF) 0.25% -1:200000 IJ SOLN
INTRAMUSCULAR | Status: AC
  Filled 2022-01-04: qty 30

## 2022-01-04 MED ORDER — OXYCODONE HCL 5 MG PO TABS
5.0000 mg | ORAL_TABLET | Freq: Four times a day (QID) | ORAL | Status: DC | PRN
Start: 2022-01-04 — End: 2022-01-04
  Administered 2022-01-04: 5 mg via ORAL

## 2022-01-04 MED ORDER — DEXAMETHASONE SODIUM PHOSPHATE 10 MG/ML IJ SOLN
INTRAMUSCULAR | Status: AC
  Filled 2022-01-04: qty 1

## 2022-01-04 MED ORDER — FENTANYL CITRATE (PF) 100 MCG/2ML IJ SOLN
INTRAMUSCULAR | Status: AC
  Filled 2022-01-04: qty 2

## 2022-01-04 MED ORDER — AMISULPRIDE (ANTIEMETIC) 5 MG/2ML IV SOLN
10.0000 mg | Freq: Once | INTRAVENOUS | Status: AC
  Administered 2022-01-04: 10 mg via INTRAVENOUS

## 2022-01-04 MED ORDER — ACETAMINOPHEN 500 MG PO TABS: 1000.0000 mg | ORAL_TABLET | ORAL | Status: DC

## 2022-01-04 MED ORDER — SUGAMMADEX SODIUM 200 MG/2ML IV SOLN
INTRAVENOUS | Status: DC | PRN
  Administered 2022-01-04: 200 mg via INTRAVENOUS

## 2022-01-04 MED ORDER — ONDANSETRON HCL 4 MG/2ML IJ SOLN
INTRAMUSCULAR | Status: DC | PRN
  Administered 2022-01-04: 4 mg via INTRAVENOUS

## 2022-01-04 MED ORDER — DIPHENHYDRAMINE HCL 50 MG/ML IJ SOLN
INTRAMUSCULAR | Status: DC | PRN
  Administered 2022-01-04: 12.5 mg via INTRAVENOUS

## 2022-01-04 MED ORDER — CHLORHEXIDINE GLUCONATE 0.12 % MT SOLN
15.0000 mL | OROMUCOSAL | Status: AC
  Filled 2022-01-04: qty 15

## 2022-01-04 MED ORDER — LIDOCAINE 2% (20 MG/ML) 5 ML SYRINGE
INTRAMUSCULAR | Status: DC | PRN
  Administered 2022-01-04: 60 mg via INTRAVENOUS

## 2022-01-04 MED ORDER — 0.9 % SODIUM CHLORIDE (POUR BTL) OPTIME
TOPICAL | Status: DC | PRN
  Administered 2022-01-04: 1000 mL

## 2022-01-04 MED ORDER — DEXAMETHASONE SODIUM PHOSPHATE 10 MG/ML IJ SOLN
INTRAMUSCULAR | Status: DC | PRN
  Administered 2022-01-04: 10 mg via INTRAVENOUS

## 2022-01-04 MED ORDER — PROPOFOL 10 MG/ML IV BOLUS
INTRAVENOUS | Status: DC | PRN
  Administered 2022-01-04: 160 mg via INTRAVENOUS

## 2022-01-04 MED ORDER — MIDAZOLAM HCL 2 MG/2ML IJ SOLN
INTRAMUSCULAR | Status: DC | PRN
Start: 2022-01-04 — End: 2022-01-04
  Administered 2022-01-04: 2 mg via INTRAVENOUS

## 2022-01-04 MED ORDER — CHLORHEXIDINE GLUCONATE CLOTH 2 % EX PADS: 6.0000 | MEDICATED_PAD | Freq: Once | CUTANEOUS | Status: DC

## 2022-01-04 MED ORDER — BUPIVACAINE-EPINEPHRINE 0.25% -1:200000 IJ SOLN
INTRAMUSCULAR | Status: DC | PRN
  Administered 2022-01-04: 9 mL

## 2022-01-04 MED ORDER — AMISULPRIDE (ANTIEMETIC) 5 MG/2ML IV SOLN
INTRAVENOUS | Status: AC
  Filled 2022-01-04: qty 4

## 2022-01-04 MED ORDER — ONDANSETRON HCL 4 MG/2ML IJ SOLN
INTRAMUSCULAR | Status: AC
  Filled 2022-01-04: qty 2

## 2022-01-04 MED ORDER — ACETAMINOPHEN 10 MG/ML IV SOLN
INTRAVENOUS | Status: AC
  Filled 2022-01-04: qty 100

## 2022-01-04 MED ORDER — LIDOCAINE 2% (20 MG/ML) 5 ML SYRINGE
INTRAMUSCULAR | Status: AC
  Filled 2022-01-04: qty 5

## 2022-01-04 MED ORDER — SODIUM CHLORIDE 0.9 % IV SOLN
INTRAVENOUS | Status: DC | PRN
  Administered 2022-01-04: 12 mL

## 2022-01-04 MED ORDER — CEFAZOLIN SODIUM-DEXTROSE 2-4 GM/100ML-% IV SOLN
INTRAVENOUS | Status: AC
  Filled 2022-01-04: qty 100

## 2022-01-04 MED ORDER — ACETAMINOPHEN 10 MG/ML IV SOLN
1000.0000 mg | Freq: Once | INTRAVENOUS | Status: DC | PRN
  Administered 2022-01-04: 1000 mg via INTRAVENOUS

## 2022-01-04 MED ORDER — SUCCINYLCHOLINE CHLORIDE 200 MG/10ML IV SOSY
PREFILLED_SYRINGE | INTRAVENOUS | Status: AC
  Filled 2022-01-04: qty 10

## 2022-01-04 MED ORDER — CHLORHEXIDINE GLUCONATE 0.12 % MT SOLN
OROMUCOSAL | Status: AC
  Administered 2022-01-04: 15 mL via OROMUCOSAL
  Filled 2022-01-04: qty 15

## 2022-01-04 MED ORDER — CEFAZOLIN SODIUM-DEXTROSE 2-4 GM/100ML-% IV SOLN
2.0000 g | INTRAVENOUS | Status: AC
  Administered 2022-01-04: 2 g via INTRAVENOUS

## 2022-01-04 MED ORDER — OXYCODONE HCL 5 MG PO TABS: 5.0000 mg | ORAL_TABLET | Freq: Four times a day (QID) | ORAL | 0 refills | Status: AC | PRN

## 2022-01-04 SURGICAL SUPPLY — 47 items
APL PRP STRL LF DISP 70% ISPRP (MISCELLANEOUS) ×1
APL SKNCLS STERI-STRIP NONHPOA (GAUZE/BANDAGES/DRESSINGS) ×1
APPLIER CLIP ROT 10 11.4 M/L (STAPLE) ×2
APR CLP MED LRG 11.4X10 (STAPLE) ×1
BAG COUNTER SPONGE SURGICOUNT (BAG) ×2 IMPLANT
BAG SPEC RTRVL 10 TROC 200 (ENDOMECHANICALS) ×1
BAG SPNG CNTER NS LX DISP (BAG) ×1
BENZOIN TINCTURE PRP APPL 2/3 (GAUZE/BANDAGES/DRESSINGS) ×2 IMPLANT
CANISTER SUCT 3000ML PPV (MISCELLANEOUS) ×2 IMPLANT
CHLORAPREP W/TINT 26 (MISCELLANEOUS) ×2 IMPLANT
CLIP APPLIE ROT 10 11.4 M/L (STAPLE) ×1 IMPLANT
COVER MAYO STAND STRL (DRAPES) ×2 IMPLANT
COVER SURGICAL LIGHT HANDLE (MISCELLANEOUS) ×2 IMPLANT
DRAPE C-ARM 42X120 X-RAY (DRAPES) ×2 IMPLANT
DRSG TEGADERM 2-3/8X2-3/4 SM (GAUZE/BANDAGES/DRESSINGS) ×6 IMPLANT
DRSG TEGADERM 4X4.75 (GAUZE/BANDAGES/DRESSINGS) ×2 IMPLANT
ELECT REM PT RETURN 9FT ADLT (ELECTROSURGICAL) ×2
ELECTRODE REM PT RTRN 9FT ADLT (ELECTROSURGICAL) ×1 IMPLANT
GAUZE SPONGE 2X2 8PLY STRL LF (GAUZE/BANDAGES/DRESSINGS) ×1 IMPLANT
GLOVE BIO SURGEON STRL SZ7 (GLOVE) ×2 IMPLANT
GLOVE BIOGEL PI IND STRL 7.5 (GLOVE) ×1 IMPLANT
GLOVE BIOGEL PI INDICATOR 7.5 (GLOVE) ×1
GOWN STRL REUS W/ TWL LRG LVL3 (GOWN DISPOSABLE) ×3 IMPLANT
GOWN STRL REUS W/TWL LRG LVL3 (GOWN DISPOSABLE) ×6
KIT BASIN OR (CUSTOM PROCEDURE TRAY) ×2 IMPLANT
KIT TURNOVER KIT B (KITS) ×2 IMPLANT
NS IRRIG 1000ML POUR BTL (IV SOLUTION) ×2 IMPLANT
PAD ARMBOARD 7.5X6 YLW CONV (MISCELLANEOUS) ×2 IMPLANT
POUCH RETRIEVAL ECOSAC 10 (ENDOMECHANICALS) IMPLANT
POUCH RETRIEVAL ECOSAC 10MM (ENDOMECHANICALS) ×2
SCISSORS LAP 5X35 DISP (ENDOMECHANICALS) ×2 IMPLANT
SET CHOLANGIOGRAPH 5 50 .035 (SET/KITS/TRAYS/PACK) ×2 IMPLANT
SET IRRIG TUBING LAPAROSCOPIC (IRRIGATION / IRRIGATOR) ×2 IMPLANT
SET TUBE SMOKE EVAC HIGH FLOW (TUBING) ×2 IMPLANT
SLEEVE ENDOPATH XCEL 5M (ENDOMECHANICALS) ×2 IMPLANT
SPECIMEN JAR SMALL (MISCELLANEOUS) ×2 IMPLANT
SPONGE GAUZE 2X2 STER 10/PKG (GAUZE/BANDAGES/DRESSINGS) ×1
STRIP CLOSURE SKIN 1/2X4 (GAUZE/BANDAGES/DRESSINGS) ×2 IMPLANT
SUT MNCRL AB 4-0 PS2 18 (SUTURE) ×2 IMPLANT
TOWEL GREEN STERILE (TOWEL DISPOSABLE) ×2 IMPLANT
TOWEL GREEN STERILE FF (TOWEL DISPOSABLE) ×2 IMPLANT
TRAY LAPAROSCOPIC MC (CUSTOM PROCEDURE TRAY) ×2 IMPLANT
TROCAR XCEL BLUNT TIP 100MML (ENDOMECHANICALS) ×2 IMPLANT
TROCAR XCEL NON-BLD 11X100MML (ENDOMECHANICALS) ×2 IMPLANT
TROCAR XCEL NON-BLD 5MMX100MML (ENDOMECHANICALS) ×2 IMPLANT
WARMER LAPAROSCOPE (MISCELLANEOUS) ×1 IMPLANT
WATER STERILE IRR 1000ML POUR (IV SOLUTION) ×2 IMPLANT

## 2022-01-04 NOTE — Discharge Instructions (Signed)
[facility] ______[facility] LAPAROSCOPIC SURGERY: POST OP INSTRUCTIONS Always review your discharge instruction sheet given to you by the facility where your surgery was performed. IF YOU HAVE DISABILITY OR FAMILY LEAVE FORMS, YOU MUST BRING THEM TO THE OFFICE FOR PROCESSING.   DO NOT GIVE THEM TO YOUR DOCTOR.  A prescription for pain medication may be given to you upon discharge.  Take your pain medication as prescribed, if needed.  If narcotic pain medicine is not needed, then you may take acetaminophen (Tylenol) or ibuprofen (Advil) as needed. Take your usually prescribed medications unless otherwise directed. If you need a refill on your pain medication, please contact your pharmacy.  They will contact our office to request authorization. Prescriptions will not be filled after 5pm or on week-ends. You should follow a light diet the first few days after arrival home, such as soup and crackers, etc.  Be sure to include lots of fluids daily. Most patients will experience some swelling and bruising in the area of the incisions.  Ice packs will help.  Swelling and bruising can take several days to resolve.  It is common to experience some constipation if taking pain medication after surgery.  Increasing fluid intake and taking a stool softener (such as Colace) will usually help or prevent this problem from occurring.  A mild laxative (Milk of Magnesia or Miralax) should be taken according to package instructions if there are no bowel movements after 48 hours. Unless discharge instructions indicate otherwise, you may remove your bandages 24-48 hours after surgery, and you may shower at that time.  You may have steri-strips (small skin tapes) in place directly over the incision.  These strips should be left on the skin for 7-10 days.  If your surgeon used skin glue on the incision, you may shower in 24 hours.  The glue will flake off over the next 2-3 weeks.  Any sutures or staples will be  removed at the office during your follow-up visit. ACTIVITIES:  You may resume regular (light) daily activities beginning the next day--such as daily self-care, walking, climbing stairs--gradually increasing activities as tolerated.  You may have sexual intercourse when it is comfortable.  Refrain from any heavy lifting or straining until approved by your doctor. You may drive when you are no longer taking prescription pain medication, you can comfortably wear a seatbelt, and you can safely maneuver your car and apply brakes. RETURN TO WORK:  __________________________________________________________ You should see your doctor in the office for a follow-up appointment approximately 2-3 weeks after your surgery.  Make sure that you call for this appointment within a day or two after you arrive home to insure a convenient appointment time. OTHER INSTRUCTIONS: __________________________________________________________________________________________________________________________ __________________________________________________________________________________________________________________________ WHEN TO CALL YOUR DOCTOR: Fever over 101.0 Inability to urinate Continued bleeding from incision. Increased pain, redness, or drainage from the incision. Increasing abdominal pain  The clinic staff is available to answer your questions during regular business hours.  Please don't hesitate to call and ask to speak to one of the nurses for clinical concerns.  If you have a medical emergency, go to the nearest emergency room or call 911.  A surgeon from [facility] is always on call at the hospital. [address], [address], [place], [place]  [address] ? [address] [address], , [place]   [address] [phone] ? [phone] ? FAX [phone] Web site: [email or web address]  

## 2022-01-04 NOTE — Transfer of Care (Signed)
Immediate Anesthesia Transfer of Care Note ? ?Patient: Lynn Schmidt ? ?Procedure(s) Performed: LAPAROSCOPIC CHOLECYSTECTOMY WITH INTRAOPERATIVE CHOLANGIOGRAM ? ?Patient Location: PACU ? ?Anesthesia Type:General ? ?Level of Consciousness: awake, alert  and oriented ? ?Airway & Oxygen Therapy: Patient connected to face mask oxygen ? ?Post-op Assessment: Post -op Vital signs reviewed and stable ? ?Post vital signs: stable ? ?Last Vitals:  ?Vitals Value Taken Time  ?BP 139/82 01/04/22 0855  ?Temp 36.3 ?C 01/04/22 0855  ?Pulse 81 01/04/22 0856  ?Resp 14 01/04/22 0856  ?SpO2 96 % 01/04/22 0856  ?Vitals shown include unvalidated device data. ? ?Last Pain:  ?Vitals:  ? 01/04/22 0855  ?TempSrc:   ?PainSc: Asleep  ?   ? ?Patients Stated Pain Goal: 0 (01/04/22 0550) ? ?Complications: No notable events documented. ?

## 2022-01-04 NOTE — Op Note (Signed)
Laparoscopic Cholecystectomy with IOC Procedure Note ? 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  The patient has suffered from indigestion for several years.  These more recent episodes are associated with eating.  She describes a lot of postprandial nausea as well as abdominal bloating.  She has had longstanding diarrhea and carries a diagnosis of irritable bowel syndrome.  She was referred to see Dr. Collene Mares.  EGD showed some gastritis.  Ultrasound revealed multiple gallstones.  There was some mild gallbladder wall thickening but no pericholecystic fluid.  Liver function test in September 2022 showed normal liver functions. ?  ? ?Pre-operative Diagnosis: Calculus of gallbladder with other cholecystitis, without mention of obstruction ? ?Post-operative Diagnosis: Same ? ?Surgeon: Maia Petties  ? ?Assistants: Ewell Poe, RNFA ? ?Anesthesia: General endotracheal anesthesia ? ?ASA Class: 2 ? ?Procedure Details  ?The patient was seen again in the Holding Room. The risks, benefits, complications, treatment options, and expected outcomes were discussed with the patient. The possibilities of reaction to medication, pulmonary aspiration, perforation of viscus, bleeding, recurrent infection, finding a normal gallbladder, the need for additional procedures, failure to diagnose a condition, the possible need to convert to an open procedure, and creating a complication requiring transfusion or operation were discussed with the patient. The likelihood of improving the patient's symptoms with return to their baseline status is good.  The patient and/or family concurred with the proposed plan, giving informed consent. The site of surgery properly noted. The patient was taken to Operating Room, identified as Lynn Schmidt and the procedure verified as  Laparoscopic Cholecystectomy with Intraoperative Cholangiogram. A Time Out was held and the above information confirmed. ? ?Prior to the induction of general anesthesia, antibiotic prophylaxis was administered. General endotracheal anesthesia was then administered and tolerated well. After the induction, the abdomen was prepped with Chloraprep and draped in the sterile fashion. The patient was positioned in the supine position. ? ?Local anesthetic agent was injected into the skin below the umbilicus and an incision made. We dissected down to the abdominal fascia with blunt dissection.  The fascia was incised vertically and we entered the peritoneal cavity bluntly.  A pursestring suture of 0-Vicryl was placed around the fascial opening.  The Hasson cannula was inserted and secured with the stay suture.  Pneumoperitoneum was then created with CO2 and tolerated well without any adverse changes in the patient's vital signs. An 11-mm port was placed in the subxiphoid position.  Two 5-mm ports were placed in the right upper quadrant. All skin incisions were infiltrated with a local anesthetic agent before making the incision and placing the trocars.  ? ?We positioned the patient in reverse Trendelenburg, tilted slightly to the patient's left.  The gallbladder was identified, the fundus grasped and retracted cephalad. There are omental adhesions to the gallbladder.  There are obvious stones within the gallbladder. Adhesions were lysed bluntly and with the electrocautery where indicated, taking care not to injure any adjacent organs or viscus. The infundibulum was grasped and retracted laterally, exposing the peritoneum overlying the triangle of Calot. This was then divided and exposed in a blunt fashion. A critical view of the cystic duct and cystic artery was obtained.  The cystic duct was clearly identified and bluntly dissected circumferentially. The cystic duct was ligated with a clip distally.   An incision was made  in the cystic duct and the [facility] cholangiogram catheter introduced. The catheter was secured using a clip. A cholangiogram was then obtained which showed good visualization of the distal and proximal biliary tree with no sign of filling defects or obstruction.  Contrast flowed easily into the duodenum. The catheter was then removed.  ? ?The cystic duct was then ligated with clips and divided. The cystic artery was identified, dissected free, ligated with clips and divided as well.  ? ?The gallbladder was dissected from the liver bed in retrograde fashion with the electrocautery. The gallbladder was removed and placed in an Eco sac. The liver bed was irrigated and inspected. Hemostasis was achieved with the electrocautery. Copious irrigation was utilized and was repeatedly aspirated until clear.  The gallbladder and Eco sac were then removed through the umbilical port site.  The pursestring suture was used to close the umbilical fascia.   ? ?We again inspected the right upper quadrant for hemostasis.  Pneumoperitoneum was released as we removed the trocars.  4-0 Monocryl was used to close the skin.   Benzoin, steri-strips, and clean dressings were applied. The patient was then extubated and brought to the recovery room in stable condition. Instrument, sponge, and needle counts were correct at closure and at the conclusion of the case.  ? ?Findings: ?Cholecystitis with Cholelithiasis ? ?Estimated Blood Loss: less than 50 mL ?        ?Drains: none ?        ?Specimens: Gallbladder     ?      ?Complications: None; patient tolerated the procedure well. ?        ?Disposition: PACU - hemodynamically stable. ?        ?Condition: stable ? ?Imogene Burn. Clarity Ciszek, MD, FACS ?[facility]  ?General Surgery ? ? ?01/04/2022 ?8:30 AM ? ? ? ?

## 2022-01-04 NOTE — Anesthesia Postprocedure Evaluation (Signed)
Anesthesia Post Note ? ?Patient: Lynn Schmidt ? ?Procedure(s) Performed: LAPAROSCOPIC CHOLECYSTECTOMY WITH INTRAOPERATIVE CHOLANGIOGRAM ? ?  ? ?Patient location during evaluation: PACU ?Anesthesia Type: General ?Level of consciousness: awake and alert ?Pain management: pain level controlled ?Vital Signs Assessment: post-procedure vital signs reviewed and stable ?Respiratory status: spontaneous breathing, nonlabored ventilation, respiratory function stable and patient connected to nasal cannula oxygen ?Cardiovascular status: blood pressure returned to baseline and stable ?Postop Assessment: no apparent nausea or vomiting ?Anesthetic complications: no ? ? ?No notable events documented. ? ?Last Vitals:  ?Vitals:  ? 01/04/22 0937 01/04/22 0948  ?BP: 114/74 122/71  ?Pulse: (!) 59 (!) 59  ?Resp: 17 15  ?Temp:  36.5 ?C  ?SpO2: 98% 100%  ?  ?Last Pain:  ?Vitals:  ? 01/04/22 0922  ?TempSrc:   ?PainSc: Asleep  ? ? ?  ?  ?  ?  ?  ?  ? ?March Rummage Asiana Benninger ? ? ? ? ?

## 2022-01-04 NOTE — Anesthesia Procedure Notes (Signed)
Procedure Name: Intubation ?Date/Time: 01/04/2022 7:43 AM ?Performed by: Lavell Luster, CRNA ?Pre-anesthesia Checklist: Patient identified, Emergency Drugs available, Suction available, Patient being monitored and Timeout performed ?Patient Re-evaluated:Patient Re-evaluated prior to induction ?Oxygen Delivery Method: Circle system utilized ?Preoxygenation: Pre-oxygenation with 100% oxygen ?Induction Type: IV induction ?Ventilation: Mask ventilation without difficulty ?Laryngoscope Size: Mac and 3 ?Grade View: Grade II ?Tube type: Oral ?Tube size: 7.0 mm ?Number of attempts: 2 ?Airway Equipment and Method: Stylet ?Placement Confirmation: ETT inserted through vocal cords under direct vision, positive ETCO2 and breath sounds checked- equal and bilateral ?Secured at: 22 cm ?Tube secured with: Tape ?Dental Injury: Teeth and Oropharynx as per pre-operative assessment  ?Comments: DL x 1 with poor view of cords, repositioned patient DL x 2 with better view and ETT passed easily.  Henderson Cloud, CRNA ? ? ? ? ?

## 2022-01-04 NOTE — H&P (Signed)
?Subjective  ?  ?Chief Complaint: Cholelithiasis ?  ?  ?  ?History of Present Illness: ?Lynn Schmidt is a 56 y.o. female who is seen today as an office consultation at the request of Dr. Collene Mares for evaluation of Cholelithiasis ?.   ?  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  The patient has suffered from indigestion for several years.  These more recent episodes are associated with eating.  She describes a lot of postprandial nausea as well as abdominal bloating.  She has had longstanding diarrhea and carries a diagnosis of irritable bowel syndrome.  She was referred to see Dr. Collene Mares.  EGD showed some gastritis.  Ultrasound revealed multiple gallstones.  There was some mild gallbladder wall thickening but no pericholecystic fluid.  Liver function test in September 2022 showed normal liver functions. ?  ?  ?Review of Systems: ?A complete review of systems was obtained from the patient.  I have reviewed this information and discussed as appropriate with the patient.  See HPI as well for other ROS. ?  ?Review of Systems  ?Constitutional: Negative.   ?HENT: Negative.   ?Eyes: Negative.   ?Respiratory: Negative.   ?Cardiovascular: Negative.   ?Gastrointestinal: Positive for abdominal pain, heartburn and nausea.  ?Genitourinary: Positive for flank pain.  ?Musculoskeletal: Positive for back pain.  ?Skin: Negative.   ?Neurological: Negative.   ?Endo/Heme/Allergies: Negative.   ?Psychiatric/Behavioral: Negative.   ?  ?  ?  ?Medical History: ?Past Medical History  ?       ?Past Medical History:  ?Diagnosis Date  ? Anxiety    ? Arthritis    ? GERD (gastroesophageal reflux disease)    ? Hyperlipidemia    ?  ?  ?  ?There is no problem list on file for this patient. ?  ?  ?Past Surgical History  ?         ?Past Surgical History:  ?Procedure Laterality Date  ?  HYSTERECTOMY      ?  ?  ?  ?Allergies  ?         ?Allergies  ?Allergen Reactions  ? Ciprofloxacin Anaphylaxis  ? Aspirin Other (See Comments)  ?    Gastritis. ?Gastritis. ?   ? Ibuprofen Other (See Comments)  ?    Gastritis. ?Gastritis. ?   ? Metronidazole Hives  ? Penicillins Hives and Rash  ?    Hasn't taken since she was a child ?Hasn't taken since she was a child ?   ?  ?  ?  ?           ?Current Outpatient Medications on File Prior to Visit  ?Medication Sig Dispense Refill  ? multivitamin with iron (CENTRUM ADULT COMPLETE) tablet Take by mouth      ? rosuvastatin (CRESTOR) 10 MG tablet        ? traZODone (DESYREL) 50 MG tablet        ?  ?No current facility-administered medications on file prior to visit.  ?  ?  ?Family History  ?         ?Family History  ?Problem Relation Age of Onset  ? Skin cancer Mother    ? High blood pressure (Hypertension) Mother    ? Hyperlipidemia (Elevated cholesterol) Mother    ? Diabetes Mother    ? Breast cancer Mother    ?  Obesity Father    ? High blood pressure (Hypertension) Father    ? Hyperlipidemia (Elevated cholesterol) Father    ? Heart valve disease Father    ? Hyperlipidemia (Elevated cholesterol) Brother    ?  ?  ?  ?Social History  ?  ?     ?Tobacco Use  ?Smoking Status Never  ?Smokeless Tobacco Never  ?  ?  ?Social History  ?Social History  ?  ?       ?Socioeconomic History  ? Marital status: Married  ?Tobacco Use  ? Smoking status: Never  ? Smokeless tobacco: Never  ?Vaping Use  ? Vaping Use: Never used  ?Substance and Sexual Activity  ? Alcohol use: Never  ? Drug use: Never  ?  ?  ?  ?Objective:  ?  ?  ?     ?Vitals:  ?   ?BP: 110/70  ?Pulse: 95  ?Temp: 36.8 ?C (98.3 ?F)  ?SpO2: 97%  ?Weight: 71.6 kg (157 lb 12.8 oz)  ?Height: 170.2 cm ('5\' 7"'$ )  ?  ?Body mass index is 24.71 kg/m?. ?  ?Physical Exam  ?  ?Constitutional:  WDWN in NAD, conversant, no obvious deformities; lying in bed comfortably ?Eyes:  Pupils equal, round; sclera anicteric; moist conjunctiva; no lid  lag ?HENT:  Oral mucosa moist; good dentition  ?Neck:  No masses palpated, trachea midline; no thyromegaly ?Lungs:  CTA bilaterally; normal respiratory effort ?CV:  Regular rate and rhythm; no murmurs; extremities well-perfused with no edema ?Abd:  +bowel sounds, soft, mild tenderness in the right upper quadrant and epigastrium, no palpable organomegaly; no palpable hernias ?Musc:  Unable to assess gait; no apparent clubbing or cyanosis in extremities ?Lymphatic:  No palpable cervical or axillary lymphadenopathy ?Skin:  Warm, dry; no sign of jaundice ?Psychiatric - alert and oriented x 4; calm mood and affect ?  ?  ?Labs, Imaging and Diagnostic Testing: ?CLINICAL DATA:  Right upper quadrant pain ?  ?EXAM: ?ULTRASOUND ABDOMEN LIMITED RIGHT UPPER QUADRANT ?  ?COMPARISON:  CT abdomen/pelvis 12/23/2019. ?  ?FINDINGS: ?Gallbladder: ?  ?The gallbladder is impacted with shadowing gallstones. There is mild ?gallbladder wall thickening measuring up to 4 mm. There is no ?pericholecystic fluid. Sonographic Murphy's sign was reported ?negative. ?  ?Common bile duct: ?  ?Diameter: 4 mm. ?  ?Liver: ?  ?No focal lesion identified. Within normal limits in parenchymal ?echogenicity. Portal vein is patent on color Doppler imaging with ?normal direction of blood flow towards the liver. ?  ?Other: None. ?  ?IMPRESSION: ?The gallbladder is impacted with gallstones with mild gallbladder ?wall thickening but no pericholecystic fluid. Sonographic Percell Miller ?sign was reported negative. HIDA scan may be considered to evaluate ?for acute cholecystitis, as indicated. ?  ?  ?Electronically Signed ?  By: Valetta Mole M.D. ?  On: 10/20/2021 10:15 ?  ?  ?Assessment and Plan:  ?Diagnoses and all orders for this visit: ?  ?Calculus of gallbladder with chronic cholecystitis without obstruction ?  ?  ?  ?Laparoscopic cholecystectomy with intraoperative cholangiogram.The surgical procedure has been discussed with the patient.  Potential risks, benefits,  alternative treatments, and expected outcomes have been explained.  All of the patient's questions at this time have been answered.  The likelihood of reaching the patient's treatment goal is good.  The patient understand the proposed surgical procedure and wishes to proceed. ?  ?  ?Imogene Burn. Mashawn Brazil, MD, FACS ?[facility]  ?General Surgery ? ? ?01/04/2022 ?7:21 AM ? ?

## 2022-01-05 ENCOUNTER — Encounter (HOSPITAL_COMMUNITY): Payer: Self-pay | Admitting: Surgery

## 2022-01-05 LAB — SURGICAL PATHOLOGY

## 2022-03-11 IMAGING — US US ABDOMEN LIMITED
1 series · 15 of 25 positions shown · non-contrast
Comparison: CT abdomen/pelvis 12/23/2019.

CLINICAL DATA: Right upper quadrant pain

EXAM:
ULTRASOUND ABDOMEN LIMITED RIGHT UPPER QUADRANT

[Series 1: us abdomen limited ruq mc & wl · 15 of 36 slices shown]
[im 1/36]
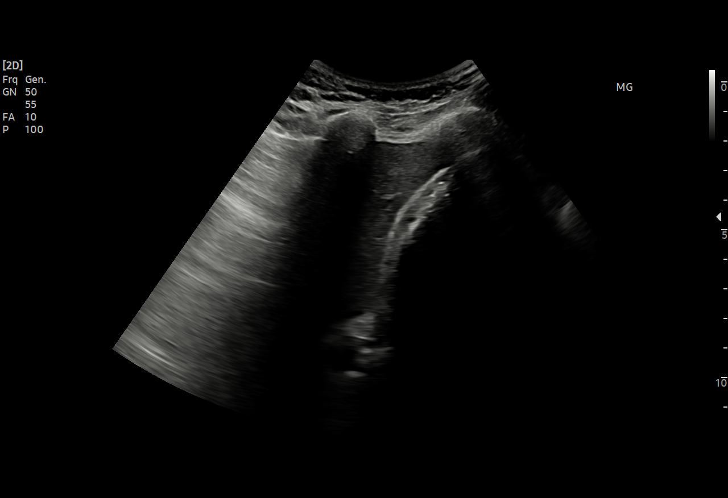
[im 3/36]
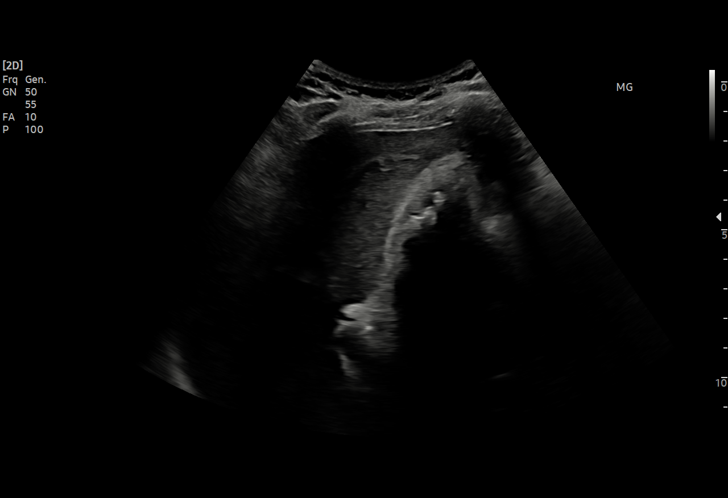
[im 6/36]
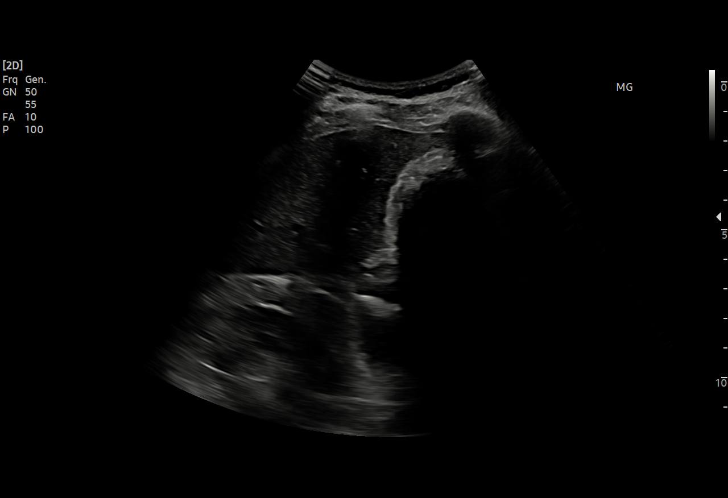
[im 8/36]
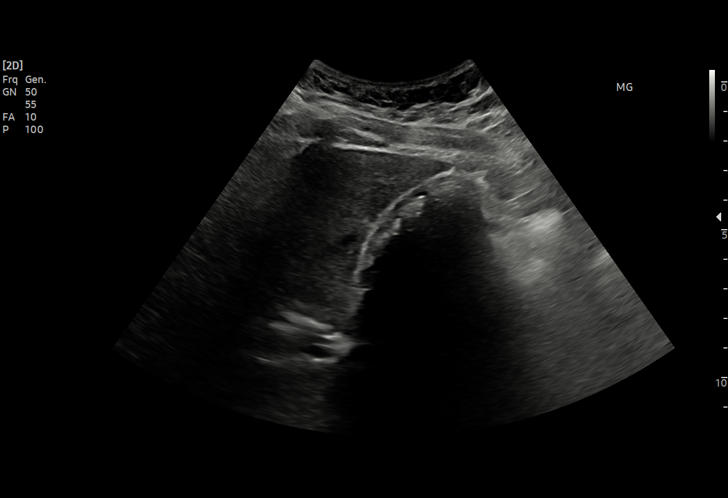
[im 11/36]
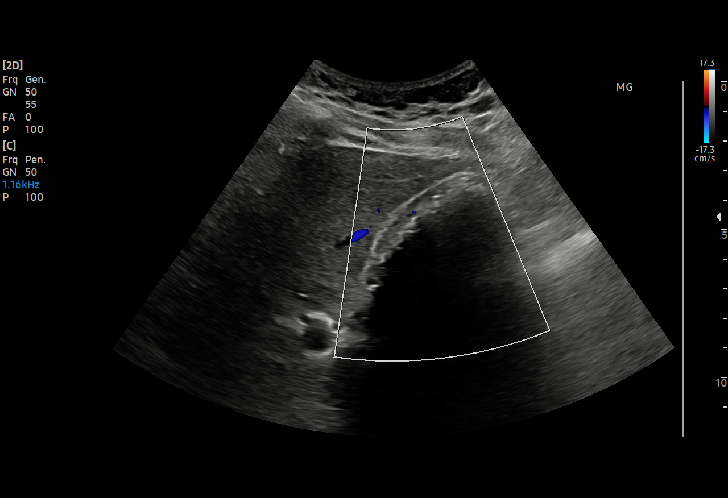
[im 14/36]
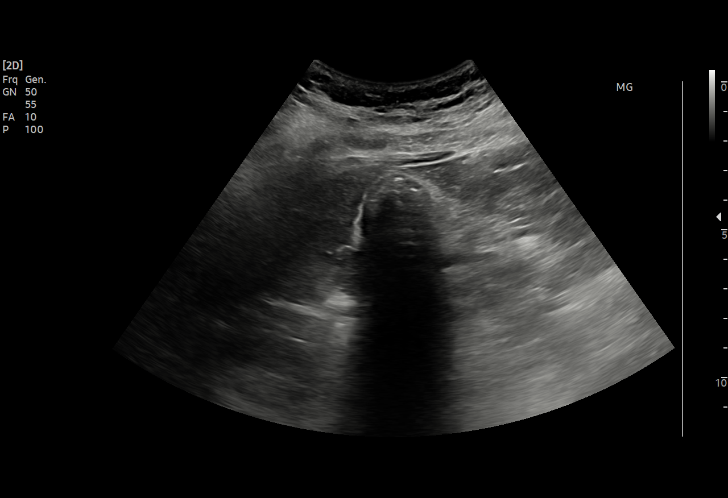
[im 15/36]
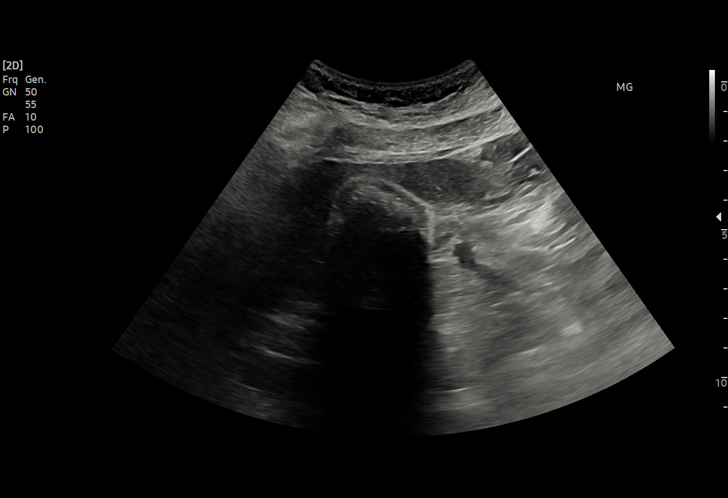
[im 18/36]
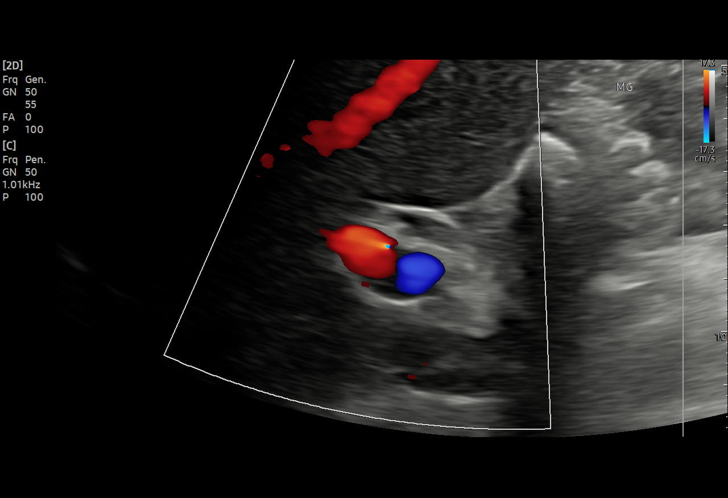
[im 21/36]
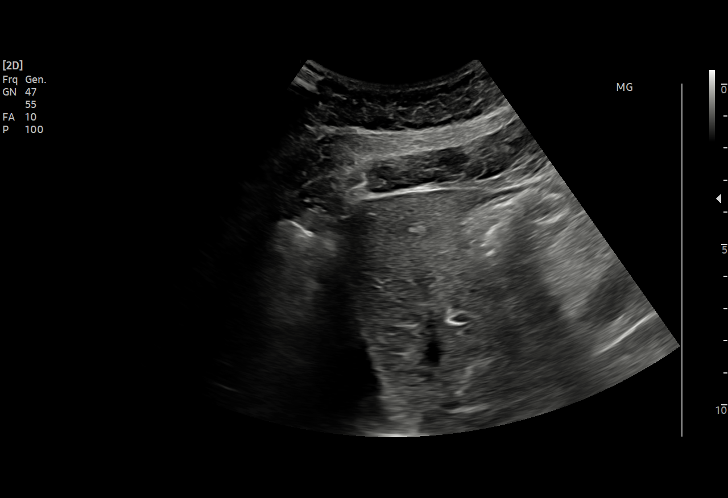
[im 22/36]
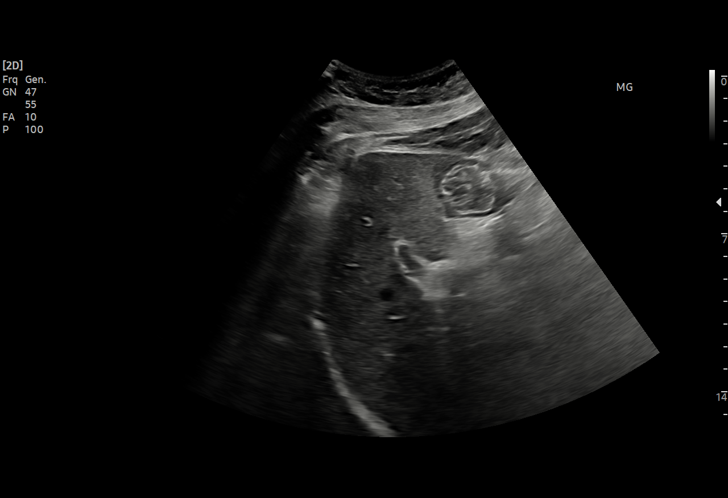
[im 25/36]
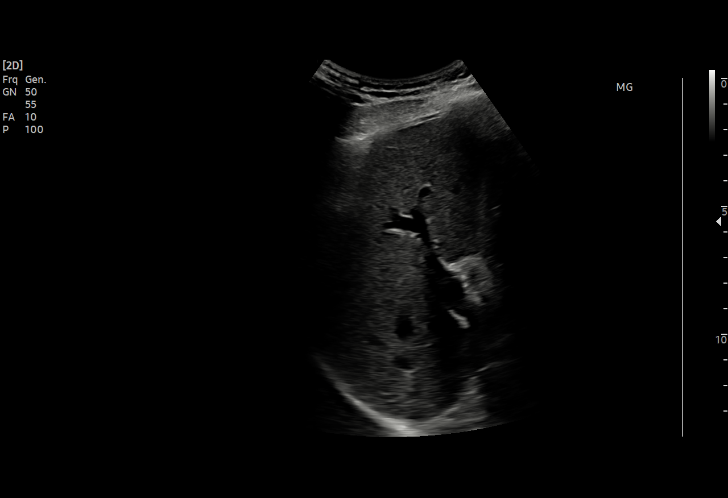
[im 28/36]
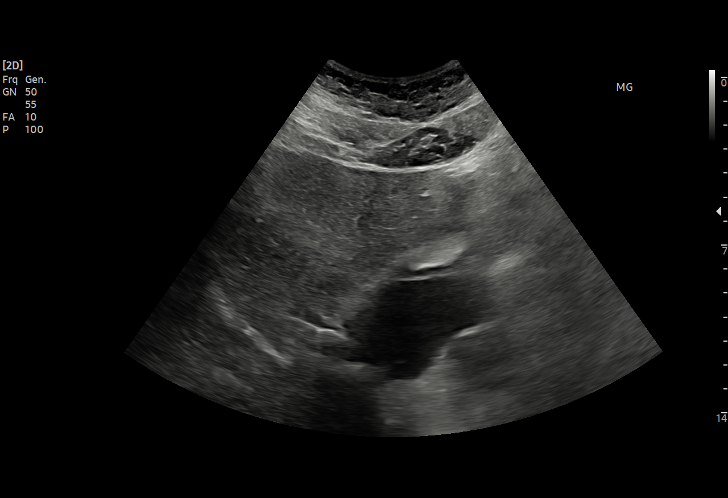
[im 30/36]
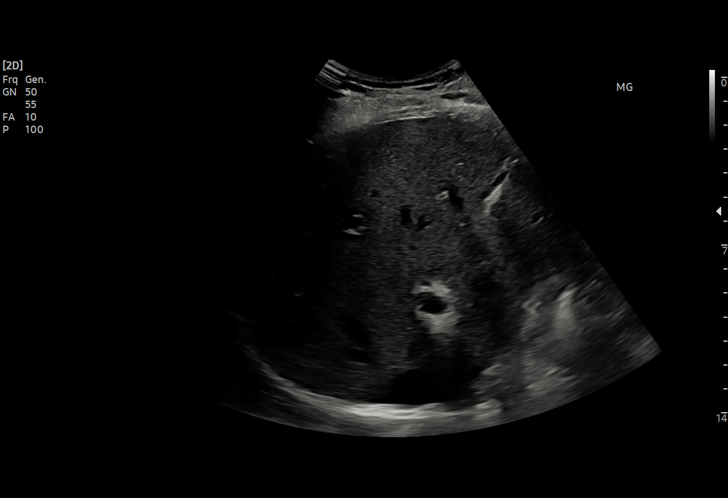
[im 33/36]
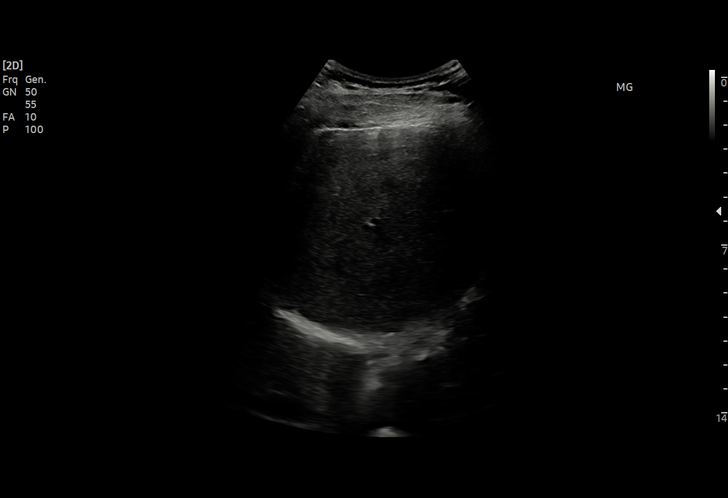
[im 36/36]
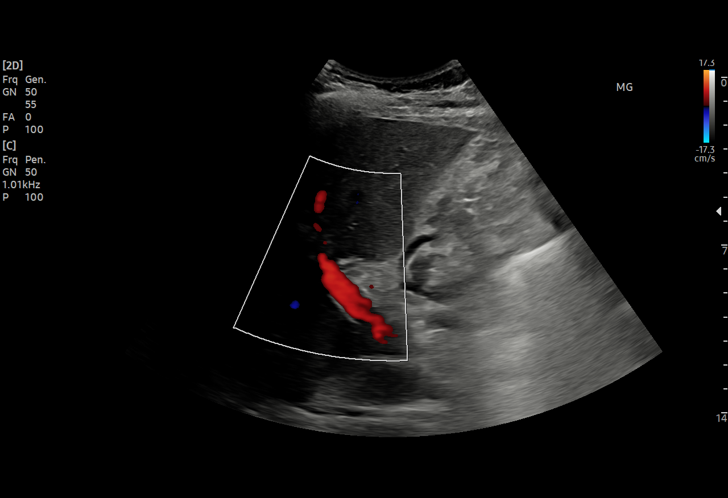

[15 of 25 positions shown; findings below may reference images not displayed]

FINDINGS: Gallbladder:

The gallbladder is impacted with shadowing gallstones. There is mild
gallbladder wall thickening measuring up to 4 mm. There is no
pericholecystic fluid. Sonographic Murphy's sign was reported
negative.

Common bile duct:

Diameter: 4 mm.

Liver:

No focal lesion identified. Within normal limits in parenchymal
echogenicity. Portal vein is patent on color Doppler imaging with
normal direction of blood flow towards the liver.

Other: None.
IMPRESSION: The gallbladder is impacted with gallstones with mild gallbladder
wall thickening but no pericholecystic fluid. Sonographic Murphy
sign was reported negative. HIDA scan may be considered to evaluate
for acute cholecystitis, as indicated.

## 2022-05-19 NOTE — Progress Notes (Unsigned)
[place] at [facility] [address], [place], [place] [address] [phone]  Date:  05/23/2022   Name:  TINIE MCGLOIN   DOB:  [birth year]   MRN:  [id number]  PCP:  Darreld Mclean, MD    Chief Complaint: No chief complaint on file.   History of Present Illness:  NADJA LINA is a 56 y.o. very pleasant female patient who presents with the following:  Pt seen today for a CPE Last seen by myself about one year ago - history of hyperlipidemia and chest pain, pre-diabetes   GYN DR Quincy Simmonds  Married to Arnold, 3 children.  Her oldest - her daughter- is a Chief Executive Officer in [place] and her 2 younger sons are medical school Enjoys outdoor exercise  She works in nursing support for nuclear stress [employer]  Tdap Pap Covid booster Flu shot  Labs on chart from year ago  More recent labs from [place] from recent admission reviewed on epic CMP from 8/16 showed slightly elevated calcium at 10.3, ALT 39  In May she was referred to general surgery by Dr. Collene Mares for symptomatic cholelithiasis-she underwent a lap chole She followed up with general surgery later that month following 2 episodes of pain-she seemed to be doing okay  However, she then went back to the emergency room in late July with severe pain, nausea and vomiting-MRCP showed ductal dilatation and she was diagnosed with interstitial pancreatitis and stayed in the hospital for several days for treatment  She followed up with [facility] gastroenterology on 8/16: Assessment: 1. Transaminitis  2. Hyperbilirubinemia  3. Acute pancreatitis without infection or necrosis, unspecified pancreatitis type  4. Presence of pancreatic duct stent  Plan:  1/2/3 - will plan to recheck labs today to ensure LFT's back in normal range. Recommended continuing with low fat diet and avoiding alcohol for a couple months to ensure pancreas has returned to normal. Will complete FMLA paperwork for patient today and  fax this for her so she can return to full duty on 04/19/22.  4 - will get x-ray of abdomen today to evaluate pancreatic stent and see if this has passed. If it is still in place, may need to schedule follow-up ERCP to remove this.     Patient Active Problem List   Diagnosis Date Noted   Prediabetes 02/18/2020   Status post total abdominal hysterectomy 03/29/2015   MYALGIA 02/16/2011    Past Medical History:  Diagnosis Date   Anemia    Anxiety    Arthritis    Diverticulitis    Dysmenorrhea    Dyspareunia    Fibroid    Urinary incontinence    occ. with sneezing    Past Surgical History:  Procedure Laterality Date   ABDOMINAL HYSTERECTOMY N/A 03/29/2015   Procedure: HYSTERECTOMY ABDOMINAL;  Surgeon: Nunzio Cobbs, MD;  Location: [facility];  Service: Gynecology;  Laterality: N/A;   BILATERAL SALPINGECTOMY Bilateral 03/29/2015   Procedure: BILATERAL SALPINGECTOMY;  Surgeon: Nunzio Cobbs, MD;  Location: [facility];  Service: Gynecology;  Laterality: Bilateral;   CHOLECYSTECTOMY N/A 01/04/2022   Procedure: LAPAROSCOPIC CHOLECYSTECTOMY WITH INTRAOPERATIVE CHOLANGIOGRAM;  Surgeon: Donnie Mesa, MD;  Location: [facility];  Service: General;  Laterality: N/A;   CYSTOSCOPY N/A 03/29/2015   Procedure: Consuela Mimes;  Surgeon: Nunzio Cobbs, MD;  Location: [facility];  Service: Gynecology;  Laterality: N/A;    Social History   Tobacco Use  Smoking status: Never   Smokeless tobacco: Never  Vaping Use   Vaping Use: Never used  Substance Use Topics   Alcohol use: Yes    Alcohol/week: 0.0 standard drinks of alcohol    Comment: rarely   Drug use: No    Family History  Problem Relation Age of Onset   Heart attack Mother    Breast cancer Mother 31       Estrogen receptive   Hypertension Mother    Hyperlipidemia Mother    Cancer Father 105       Lung ca--dec age 32   Hypertension Father    Hyperlipidemia Father    Cancer Paternal Grandmother 51       colon and  pancreatic ca    Allergies  Allergen Reactions   Asa [Aspirin] Other (See Comments)    Gastritis.   Flagyl [Metronidazole] Hives   Ibuprofen Other (See Comments)    Gastritis.   Penicillins Hives    Hasn't taken since she was a child    Medication list has been reviewed and updated.  Current Outpatient Medications on File Prior to Visit  Medication Sig Dispense Refill   acetaminophen (TYLENOL) 500 MG tablet Take 2 tablets (1,000 mg total) by mouth every 6 (six) hours as needed for mild pain. 30 tablet 0   ALPRAZolam (XANAX) 0.25 MG tablet Take 1 tablet (0.25 mg total) by mouth 3 (three) times daily as needed for anxiety. (Patient not taking: Reported on 12/22/2021) 20 tablet 0   ALPRAZolam (XANAX) 0.5 MG tablet Take 0.5 mg by mouth daily as needed (flight anxiety).     doxylamine, Sleep, (UNISOM) 25 MG tablet Take 25 mg by mouth at bedtime as needed for sleep.     estradiol (VIVELLE-DOT) 0.025 MG/24HR Place 1 patch onto the skin 2 (two) times a week. Wednesdays & Saturdays.     Multiple Vitamin (MULTIVITAMIN WITH MINERALS) TABS tablet Take 1 tablet by mouth every evening.     naphazoline-pheniramine (NAPHCON-A) 0.025-0.3 % ophthalmic solution Place 1 drop into both eyes daily as needed for irritation or allergies.     oxyCODONE (OXY IR/ROXICODONE) 5 MG immediate release tablet Take 1 tablet (5 mg total) by mouth every 6 (six) hours as needed for severe pain. 20 tablet 0   rosuvastatin (CRESTOR) 10 MG tablet Take 1 tablet (10 mg total) by mouth daily. (Patient taking differently: Take 10 mg by mouth at bedtime.) 90 tablet 3   traZODone (DESYREL) 50 MG tablet TAKE 1/2 TO 1 TABLET BY MOUTH AT BEDTIME AS NEEDED FOR SLEEP (Patient taking differently: Take 50 mg by mouth at bedtime as needed for sleep.) 90 tablet 2   No current facility-administered medications on file prior to visit.    Review of Systems:  As per HPI- otherwise negative.   Physical Examination: There were no vitals  filed for this visit. There were no vitals filed for this visit. There is no height or weight on file to calculate BMI. Ideal Body Weight:    GEN: no acute distress. HEENT: Atraumatic, Normocephalic.  Ears and Nose: No external deformity. CV: RRR, No M/G/R. No JVD. No thrill. No extra heart sounds. PULM: CTA B, no wheezes, crackles, rhonchi. No retractions. No resp. distress. No accessory muscle use. ABD: S, NT, ND, +BS. No rebound. No HSM. EXTR: No c/c/e PSYCH: Normally interactive. Conversant.    Assessment and Plan: ***  Signed Lamar Blinks, MD

## 2022-05-20 NOTE — Patient Instructions (Incomplete)
It was good to see you today!  I am sorry you have had these recent problems with your gallbladder etc.!

## 2022-05-23 ENCOUNTER — Ambulatory Visit (INDEPENDENT_AMBULATORY_CARE_PROVIDER_SITE_OTHER): Payer: No Typology Code available for payment source | Admitting: Family Medicine

## 2022-05-23 VITALS — BP 112/80 | HR 60 | Temp 97.9°F | Resp 18 | Wt 149.6 lb

## 2022-05-23 DIAGNOSIS — F4323 Adjustment disorder with mixed anxiety and depressed mood: Secondary | ICD-10-CM

## 2022-05-23 DIAGNOSIS — R5383 Other fatigue: Secondary | ICD-10-CM | POA: Diagnosis not present

## 2022-05-23 DIAGNOSIS — K851 Biliary acute pancreatitis without necrosis or infection: Secondary | ICD-10-CM | POA: Diagnosis not present

## 2022-05-23 DIAGNOSIS — Z23 Encounter for immunization: Secondary | ICD-10-CM

## 2022-05-23 DIAGNOSIS — Z6379 Other stressful life events affecting family and household: Secondary | ICD-10-CM | POA: Diagnosis not present

## 2022-05-23 DIAGNOSIS — Z1329 Encounter for screening for other suspected endocrine disorder: Secondary | ICD-10-CM | POA: Diagnosis not present

## 2022-05-23 DIAGNOSIS — Z0000 Encounter for general adult medical examination without abnormal findings: Secondary | ICD-10-CM

## 2022-05-23 MED ORDER — ALPRAZOLAM 0.25 MG PO TABS: 0.2500 mg | ORAL_TABLET | Freq: Three times a day (TID) | ORAL | 0 refills | Status: DC | PRN

## 2022-05-23 MED ORDER — FLUOXETINE HCL 20 MG PO TABS: 20.0000 mg | ORAL_TABLET | Freq: Every day | ORAL | 3 refills | Status: DC

## 2022-05-24 ENCOUNTER — Encounter: Payer: Self-pay | Admitting: Family Medicine

## 2022-05-24 LAB — COMPREHENSIVE METABOLIC PANEL
ALT: 25 U/L (ref 0–35)
AST: 24 U/L (ref 0–37)
Albumin: 4.4 g/dL (ref 3.5–5.2)
Alkaline Phosphatase: 47 U/L (ref 39–117)
BUN: 9 mg/dL (ref 6–23)
CO2: 34 mEq/L — ABNORMAL HIGH (ref 19–32)
Calcium: 10 mg/dL (ref 8.4–10.5)
Chloride: 99 mEq/L (ref 96–112)
Creatinine, Ser: 0.84 mg/dL (ref 0.40–1.20)
GFR: 77.74 mL/min (ref >60.00)
Glucose, Bld: 81 mg/dL (ref 70–99)
Potassium: 3.9 mEq/L (ref 3.5–5.1)
Sodium: 139 mEq/L (ref 135–145)
Total Bilirubin: 0.6 mg/dL (ref 0.2–1.2)
Total Protein: 7.5 g/dL (ref 6.0–8.3)

## 2022-05-24 LAB — VITAMIN D 25 HYDROXY (VIT D DEFICIENCY, FRACTURES): VITD: 36.07 ng/mL (ref 30.00–100.00)

## 2022-05-24 LAB — LIPASE: Lipase: 22 U/L (ref 11.0–59.0)

## 2022-05-24 LAB — TSH: TSH: 1.16 u[IU]/mL (ref 0.35–5.50)

## 2022-06-03 ENCOUNTER — Other Ambulatory Visit: Payer: Self-pay | Admitting: Family Medicine

## 2022-06-03 DIAGNOSIS — E785 Hyperlipidemia, unspecified: Secondary | ICD-10-CM

## 2022-06-16 ENCOUNTER — Other Ambulatory Visit: Payer: Self-pay | Admitting: Family Medicine

## 2022-06-16 DIAGNOSIS — F5101 Primary insomnia: Secondary | ICD-10-CM

## 2022-07-04 ENCOUNTER — Encounter (INDEPENDENT_AMBULATORY_CARE_PROVIDER_SITE_OTHER): Payer: No Typology Code available for payment source | Admitting: Family Medicine

## 2022-07-04 DIAGNOSIS — U071 COVID-19: Secondary | ICD-10-CM

## 2022-07-04 MED ORDER — HYDROCODONE BIT-HOMATROP MBR 5-1.5 MG/5ML PO SOLN: 5.0000 mL | Freq: Three times a day (TID) | ORAL | 0 refills | Status: AC | PRN

## 2022-07-04 MED ORDER — NIRMATRELVIR/RITONAVIR (PAXLOVID)TABLET: 3.0000 | ORAL_TABLET | Freq: Two times a day (BID) | ORAL | 0 refills | Status: AC

## 2022-07-04 NOTE — Telephone Encounter (Signed)
Please see the MyChart message reply(ies) for my assessment and plan.  The patient gave consent for this Medical Advice Message and is aware that it may result in a bill to their insurance company as well as the possibility that this may result in a co-payment or deductible. They are an established patient, but are not seeking medical advice exclusively about a problem treated during an in person or video visit in the last 7 days. I did not recommend an in person or video visit within 7 days of my reply.  I spent a total of 10 minutes cumulative time within 7 days through MyChart messaging Shaun Runyon, MD  

## 2022-07-04 NOTE — Addendum Note (Signed)
Addended by: Lamar Blinks C on: 07/04/2022 08:15 PM   Modules accepted: Orders

## 2022-08-25 ENCOUNTER — Other Ambulatory Visit: Payer: Self-pay | Admitting: Family Medicine

## 2022-08-25 DIAGNOSIS — F4323 Adjustment disorder with mixed anxiety and depressed mood: Secondary | ICD-10-CM

## 2022-09-11 LAB — HM MAMMOGRAPHY

## 2022-09-13 ENCOUNTER — Encounter: Payer: Self-pay | Admitting: Family Medicine

## 2023-03-20 ENCOUNTER — Other Ambulatory Visit: Payer: Self-pay | Admitting: Oncology

## 2023-03-20 DIAGNOSIS — Z006 Encounter for examination for normal comparison and control in clinical research program: Secondary | ICD-10-CM

## 2023-04-03 ENCOUNTER — Other Ambulatory Visit (HOSPITAL_COMMUNITY)
Admission: RE | Admit: 2023-04-03 | Discharge: 2023-04-03 | Disposition: A | Discharge status: Home / Self Care | Payer: Self-pay | Source: Ambulatory Visit | Attending: Oncology | Admitting: Oncology

## 2023-04-03 DIAGNOSIS — Z006 Encounter for examination for normal comparison and control in clinical research program: Secondary | ICD-10-CM

## 2023-07-09 NOTE — Progress Notes (Signed)
[facility] at [facility] [address] [place], [place] [address] [phone]  Date:  07/17/2023   Name:  Lynn Schmidt   DOB:  [birth year]   MRN:  [id number]  PCP:  Pearline Cables, MD    Chief Complaint: Annual Exam   History of Present Illness:  Lynn Schmidt is a 57 y.o. very pleasant female patient who presents with the following:  Pt seen today for CPE Last visit with myself 9/23  Last seen by her GYN Married to Ashley, 3 children.  Her oldest - her daughter- is a Clinical research associate in [place] and her 2 younger sons are medical school- they are both 3rd year at the same school, they are not twins however Her daughter and her youngest son got married in the last year!   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   At our last visit she recently lost her mom and had been through some issues with pancreatitis, she was feeling down and we started fluoxetine for her - she was able to wean off this medication  For the time being she does not want to go back on anything right now - she will keep me posted   She had covid about one year ago  Labs- update today  Mammo UTD Pap- s/p hyst for fibroids  Colon UTD Flu and shingrix done   There is a family history of CAD- she did a CT coronary in 2021, score of 0   Patient Active Problem List   Diagnosis Date Noted   Prediabetes 02/18/2020   Status post total abdominal hysterectomy 03/29/2015   MYALGIA 02/16/2011    Past Medical History:  Diagnosis Date   Anemia    Anxiety    Arthritis    Diverticulitis    Dysmenorrhea    Dyspareunia    Fibroid    Urinary incontinence    occ. with sneezing    Past Surgical History:  Procedure Laterality Date   ABDOMINAL HYSTERECTOMY N/A 03/29/2015   Procedure: HYSTERECTOMY ABDOMINAL;  Surgeon: Patton Salles, MD;  Location: [facility];  Service: Gynecology;   Laterality: N/A;   BILATERAL SALPINGECTOMY Bilateral 03/29/2015   Procedure: BILATERAL SALPINGECTOMY;  Surgeon: Patton Salles, MD;  Location: [facility];  Service: Gynecology;  Laterality: Bilateral;   CHOLECYSTECTOMY N/A 01/04/2022   Procedure: LAPAROSCOPIC CHOLECYSTECTOMY WITH INTRAOPERATIVE CHOLANGIOGRAM;  Surgeon: Manus Rudd, MD;  Location: [facility];  Service: General;  Laterality: N/A;   CYSTOSCOPY N/A 03/29/2015   Procedure: Bluford Kaufmann;  Surgeon: Patton Salles, MD;  Location: [facility];  Service: Gynecology;  Laterality: N/A;    Social History   Tobacco Use   Smoking status: Never   Smokeless tobacco: Never  Vaping Use   Vaping status: Never Used  Substance Use Topics   Alcohol use: Yes    Alcohol/week: 0.0 standard drinks of alcohol    Comment: rarely   Drug use: No    Family History  Problem Relation Age of Onset   Heart attack Mother    Breast cancer Mother 41       Estrogen receptive   Hypertension Mother    Hyperlipidemia Mother    Cancer Father 26       Lung ca--dec age 70   Hypertension Father    Hyperlipidemia Father  Cancer Paternal Grandmother 25       colon and pancreatic ca    Allergies  Allergen Reactions   Asa [Aspirin] Other (See Comments)    Gastritis.   Flagyl [Metronidazole] Hives   Ibuprofen Other (See Comments)    Gastritis.   Penicillins Hives    Hasn't taken since she was a child    Medication list has been reviewed and updated.  Current Outpatient Medications on File Prior to Visit  Medication Sig Dispense Refill   acetaminophen (TYLENOL) 500 MG tablet Take 2 tablets (1,000 mg total) by mouth every 6 (six) hours as needed for mild pain. 30 tablet 0   ALPRAZolam (XANAX) 0.25 MG tablet Take 1 tablet (0.25 mg total) by mouth 3 (three) times daily as needed for anxiety. 20 tablet 0   ALPRAZolam (XANAX) 0.5 MG tablet Take 0.5 mg by mouth daily as needed (flight anxiety).     doxylamine, Sleep, (UNISOM) 25 MG tablet Take 25  mg by mouth at bedtime as needed for sleep.     estradiol (VIVELLE-DOT) 0.025 MG/24HR Place 1 patch onto the skin 2 (two) times a week. Wednesdays & Saturdays.     Multiple Vitamin (MULTIVITAMIN WITH MINERALS) TABS tablet Take 1 tablet by mouth every evening.     naphazoline-pheniramine (NAPHCON-A) 0.025-0.3 % ophthalmic solution Place 1 drop into both eyes daily as needed for irritation or allergies.     oxyCODONE (OXY IR/ROXICODONE) 5 MG immediate release tablet Take 1 tablet (5 mg total) by mouth every 6 (six) hours as needed for severe pain. 20 tablet 0   No current facility-administered medications on file prior to visit.    Review of Systems:  As per HPI- otherwise negative.   Physical Examination: Vitals:   07/17/23 1525  BP: 118/76  Pulse: 65  Resp: 16  Temp: 98 F (36.7 C)  SpO2: 98%   Vitals:   07/17/23 1525  Weight: 149 lb 9.6 oz (67.9 kg)  Height: 5\' 7"  (1.702 m)   Body mass index is 23.43 kg/m. Ideal Body Weight: Weight in (lb) to have BMI = 25: 159.3  GEN: no acute distress. HEENT: Atraumatic, Normocephalic.  Ears and Nose: No external deformity. CV: RRR, No M/G/R. No JVD. No thrill. No extra heart sounds. PULM: CTA B, no wheezes, crackles, rhonchi. No retractions. No resp. distress. No accessory muscle use. ABD: S, NT, ND, +BS. No rebound. No HSM. EXTR: No c/c/e PSYCH: Normally interactive. Conversant.    Assessment and Plan: Physical exam  Thyroid disorder screening - Plan: TSH  Screening for diabetes mellitus - Plan: Comprehensive metabolic panel, Hemoglobin A1c  Screening for deficiency anemia - Plan: CBC  Screening, lipid - Plan: Lipid panel  Fatigue, unspecified type - Plan: TSH, VITAMIN D 25 Hydroxy (Vit-D Deficiency, Fractures)  Encounter for hepatitis C screening test for low risk patient - Plan: Hepatitis C antibody  Primary insomnia - Plan: traZODone (DESYREL) 50 MG tablet  Physical exam today- encouraged healthy diet and exercise  routine Will plan further follow- up pending labs. Refilled trazodone that she uses for insomnia  Signed Abbe Amsterdam, MD  Received labs as below 11/14- message to pt  Results for orders placed or performed in visit on 07/17/23  CBC  Result Value Ref Range   WBC 7.3 4.0 - 10.5 K/uL   RBC 4.75 3.87 - 5.11 Mil/uL   Platelets 344.0 150.0 - 400.0 K/uL   Hemoglobin 13.9 12.0 - 15.0 g/dL   HCT 81.1 91.4 - 78.2 %  MCV 88.7 78.0 - 100.0 fl   MCHC 33.0 30.0 - 36.0 g/dL   RDW 62.9 52.8 - 41.3 %  Comprehensive metabolic panel  Result Value Ref Range   Sodium 136 135 - 145 mEq/L   Potassium 3.8 3.5 - 5.1 mEq/L   Chloride 100 96 - 112 mEq/L   CO2 30 19 - 32 mEq/L   Glucose, Bld 82 70 - 99 mg/dL   BUN 13 6 - 23 mg/dL   Creatinine, Ser 2.44 0.40 - 1.20 mg/dL   Total Bilirubin 0.6 0.2 - 1.2 mg/dL   Alkaline Phosphatase 43 39 - 117 U/L   AST 21 0 - 37 U/L   ALT 18 0 - 35 U/L   Total Protein 7.2 6.0 - 8.3 g/dL   Albumin 4.4 3.5 - 5.2 g/dL   GFR 01.02 >72.53 mL/min   Calcium 9.3 8.4 - 10.5 mg/dL  Hemoglobin G6Y  Result Value Ref Range   Hgb A1c MFr Bld 5.7 4.6 - 6.5 %  Lipid panel  Result Value Ref Range   Cholesterol 217 (H) 0 - 200 mg/dL   Triglycerides 40.3 0.0 - 149.0 mg/dL   HDL 47.42 >59.56 mg/dL   VLDL 38.7 0.0 - 56.4 mg/dL   LDL Cholesterol 332 (H) 0 - 99 mg/dL   Total CHOL/HDL Ratio 4    NonHDL 168.96   TSH  Result Value Ref Range   TSH 1.09 0.35 - 5.50 uIU/mL  VITAMIN D 25 Hydroxy (Vit-D Deficiency, Fractures)  Result Value Ref Range   VITD 44.84 30.00 - 100.00 ng/mL

## 2023-07-17 ENCOUNTER — Ambulatory Visit: Payer: No Typology Code available for payment source | Admitting: Family Medicine

## 2023-07-17 VITALS — BP 118/76 | HR 65 | Temp 98.0°F | Resp 16 | Ht 67.0 in | Wt 149.6 lb

## 2023-07-17 DIAGNOSIS — Z131 Encounter for screening for diabetes mellitus: Secondary | ICD-10-CM

## 2023-07-17 DIAGNOSIS — Z130 Encounter for screening for diseases of the blood and blood-forming organs and certain disorders involving the immune mechanism: Secondary | ICD-10-CM | POA: Diagnosis not present

## 2023-07-17 DIAGNOSIS — F5101 Primary insomnia: Secondary | ICD-10-CM

## 2023-07-17 DIAGNOSIS — Z1329 Encounter for screening for other suspected endocrine disorder: Secondary | ICD-10-CM

## 2023-07-17 DIAGNOSIS — R5383 Other fatigue: Secondary | ICD-10-CM | POA: Diagnosis not present

## 2023-07-17 DIAGNOSIS — Z13220 Encounter for screening for lipoid disorders: Secondary | ICD-10-CM

## 2023-07-17 DIAGNOSIS — Z0000 Encounter for general adult medical examination without abnormal findings: Secondary | ICD-10-CM | POA: Diagnosis not present

## 2023-07-17 DIAGNOSIS — Z1159 Encounter for screening for other viral diseases: Secondary | ICD-10-CM

## 2023-07-17 MED ORDER — TRAZODONE HCL 50 MG PO TABS: ORAL_TABLET | ORAL | 3 refills | Status: DC

## 2023-07-17 NOTE — Patient Instructions (Addendum)
It was good to see you again today- I will be in touch with your labs asap  Keep up the good work with exercise We can repeat your CT coronary 5-7 years after your last scan It sounds like you see Dr Loreta Ave with [facility] with GI if you want to schedule a recheck  Recommend covid booster if not done in the last year or so

## 2023-07-18 ENCOUNTER — Encounter: Payer: Self-pay | Admitting: Family Medicine

## 2023-07-18 LAB — LIPID PANEL
Cholesterol: 217 mg/dL — ABNORMAL HIGH (ref 0–200)
HDL: 48.5 mg/dL (ref >39.00)
LDL Cholesterol: 154 mg/dL — ABNORMAL HIGH (ref 0–99)
NonHDL: 168.96
Total CHOL/HDL Ratio: 4
Triglycerides: 75 mg/dL (ref 0.0–149.0)
VLDL: 15 mg/dL (ref 0.0–40.0)

## 2023-07-18 LAB — COMPREHENSIVE METABOLIC PANEL
ALT: 18 U/L (ref 0–35)
AST: 21 U/L (ref 0–37)
Albumin: 4.4 g/dL (ref 3.5–5.2)
Alkaline Phosphatase: 43 U/L (ref 39–117)
BUN: 13 mg/dL (ref 6–23)
CO2: 30 meq/L (ref 19–32)
Calcium: 9.3 mg/dL (ref 8.4–10.5)
Chloride: 100 meq/L (ref 96–112)
Creatinine, Ser: 0.78 mg/dL (ref 0.40–1.20)
GFR: 84.29 mL/min (ref >60.00)
Glucose, Bld: 82 mg/dL (ref 70–99)
Potassium: 3.8 meq/L (ref 3.5–5.1)
Sodium: 136 meq/L (ref 135–145)
Total Bilirubin: 0.6 mg/dL (ref 0.2–1.2)
Total Protein: 7.2 g/dL (ref 6.0–8.3)

## 2023-07-18 LAB — HEMOGLOBIN A1C: Hgb A1c MFr Bld: 5.7 % (ref 4.6–6.5)

## 2023-07-18 LAB — CBC
HCT: 42.1 % (ref 36.0–46.0)
Hemoglobin: 13.9 g/dL (ref 12.0–15.0)
MCHC: 33 g/dL (ref 30.0–36.0)
MCV: 88.7 fL (ref 78.0–100.0)
Platelets: 344 10*3/uL (ref 150.0–400.0)
RBC: 4.75 Mil/uL (ref 3.87–5.11)
RDW: 12.6 % (ref 11.5–15.5)
WBC: 7.3 10*3/uL (ref 4.0–10.5)

## 2023-07-18 LAB — VITAMIN D 25 HYDROXY (VIT D DEFICIENCY, FRACTURES): VITD: 44.84 ng/mL (ref 30.00–100.00)

## 2023-07-18 LAB — TSH: TSH: 1.09 u[IU]/mL (ref 0.35–5.50)

## 2023-07-18 LAB — HEPATITIS C ANTIBODY: Hepatitis C Ab: NONREACTIVE

## 2023-10-08 LAB — HM MAMMOGRAPHY

## 2023-10-09 ENCOUNTER — Encounter: Payer: Self-pay | Admitting: Family Medicine

## 2023-10-21 ENCOUNTER — Other Ambulatory Visit: Payer: Self-pay | Admitting: Family Medicine

## 2023-10-21 ENCOUNTER — Encounter: Payer: Self-pay | Admitting: Family Medicine

## 2023-10-21 ENCOUNTER — Other Ambulatory Visit: Payer: Self-pay

## 2023-10-21 DIAGNOSIS — F40243 Fear of flying: Secondary | ICD-10-CM

## 2023-10-21 DIAGNOSIS — Z6379 Other stressful life events affecting family and household: Secondary | ICD-10-CM

## 2023-10-21 MED ORDER — ALPRAZOLAM 0.5 MG PO TABS: 0.5000 mg | ORAL_TABLET | Freq: Every day | ORAL | 0 refills | Status: AC | PRN

## 2023-10-21 MED ORDER — ALPRAZOLAM 0.5 MG PO TABS
0.5000 mg | ORAL_TABLET | Freq: Every day | ORAL | 0 refills | Status: DC | PRN
  Filled 2023-10-21 (×2): qty 20, 20d supply, fill #0

## 2023-10-23 ENCOUNTER — Telehealth: Payer: No Typology Code available for payment source | Admitting: Physician Assistant

## 2023-10-23 DIAGNOSIS — R3989 Other symptoms and signs involving the genitourinary system: Secondary | ICD-10-CM | POA: Diagnosis not present

## 2023-10-23 MED ORDER — SULFAMETHOXAZOLE-TRIMETHOPRIM 800-160 MG PO TABS: 1.0000 | ORAL_TABLET | Freq: Two times a day (BID) | ORAL | 0 refills | Status: AC

## 2023-10-23 NOTE — Progress Notes (Signed)

## 2023-10-23 NOTE — Progress Notes (Signed)
 I have spent 5 minutes in review of e-visit questionnaire, review and updating patient chart, medical decision making and response to patient.   Piedad Climes, PA-C

## 2024-06-16 ENCOUNTER — Other Ambulatory Visit: Payer: Self-pay | Admitting: Family Medicine

## 2024-06-16 DIAGNOSIS — F5101 Primary insomnia: Secondary | ICD-10-CM
